# Patient Record
Sex: Female | Born: 1975 | Race: White | Hispanic: No | Marital: Single | Smoking: Never smoker
Health system: Southern US, Community
[De-identification: ages and names within clinical notes are randomized; demographics above are authoritative.]

## PROBLEM LIST (undated history)

## (undated) DIAGNOSIS — Z973 Presence of spectacles and contact lenses: Secondary | ICD-10-CM

## (undated) DIAGNOSIS — R112 Nausea with vomiting, unspecified: Secondary | ICD-10-CM

## (undated) DIAGNOSIS — F909 Attention-deficit hyperactivity disorder, unspecified type: Secondary | ICD-10-CM

## (undated) DIAGNOSIS — Z87442 Personal history of urinary calculi: Secondary | ICD-10-CM

## (undated) DIAGNOSIS — R102 Pelvic and perineal pain: Secondary | ICD-10-CM

## (undated) DIAGNOSIS — G8929 Other chronic pain: Secondary | ICD-10-CM

## (undated) DIAGNOSIS — K219 Gastro-esophageal reflux disease without esophagitis: Secondary | ICD-10-CM

## (undated) DIAGNOSIS — Z87820 Personal history of traumatic brain injury: Secondary | ICD-10-CM

## (undated) DIAGNOSIS — Z9889 Other specified postprocedural states: Secondary | ICD-10-CM

## (undated) HISTORY — PX: OTHER SURGICAL HISTORY: SHX169

---

## 1998-03-24 ENCOUNTER — Other Ambulatory Visit: Admission: RE | Admit: 1998-03-24 | Discharge: 1998-03-24 | Payer: Self-pay | Admitting: Gynecology

## 1998-05-24 ENCOUNTER — Encounter: Payer: Self-pay | Admitting: *Deleted

## 1998-05-24 ENCOUNTER — Emergency Department (HOSPITAL_COMMUNITY): Admission: EM | Admit: 1998-05-24 | Discharge: 1998-05-24 | Payer: Self-pay | Admitting: *Deleted

## 1999-02-22 ENCOUNTER — Inpatient Hospital Stay (HOSPITAL_COMMUNITY): Admission: EM | Admit: 1999-02-22 | Discharge: 1999-03-02 | Payer: Self-pay | Admitting: Emergency Medicine

## 1999-02-22 ENCOUNTER — Encounter: Payer: Self-pay | Admitting: Emergency Medicine

## 1999-02-22 ENCOUNTER — Encounter: Payer: Self-pay | Admitting: General Surgery

## 1999-02-22 HISTORY — PX: OTHER SURGICAL HISTORY: SHX169

## 1999-02-23 ENCOUNTER — Encounter: Payer: Self-pay | Admitting: General Surgery

## 1999-02-24 ENCOUNTER — Encounter: Payer: Self-pay | Admitting: General Surgery

## 1999-02-25 ENCOUNTER — Encounter: Payer: Self-pay | Admitting: General Surgery

## 1999-02-27 ENCOUNTER — Encounter: Payer: Self-pay | Admitting: General Surgery

## 1999-02-28 ENCOUNTER — Encounter: Payer: Self-pay | Admitting: General Surgery

## 1999-04-29 ENCOUNTER — Encounter: Payer: Self-pay | Admitting: Neurosurgery

## 1999-04-29 ENCOUNTER — Ambulatory Visit (HOSPITAL_COMMUNITY): Admission: RE | Admit: 1999-04-29 | Discharge: 1999-04-29 | Payer: Self-pay | Admitting: Neurosurgery

## 2000-01-20 ENCOUNTER — Encounter: Payer: Self-pay | Admitting: Neurosurgery

## 2000-01-20 ENCOUNTER — Ambulatory Visit (HOSPITAL_COMMUNITY): Admission: RE | Admit: 2000-01-20 | Discharge: 2000-01-20 | Payer: Self-pay | Admitting: Neurosurgery

## 2000-02-03 ENCOUNTER — Encounter: Payer: Self-pay | Admitting: Neurosurgery

## 2000-02-03 ENCOUNTER — Ambulatory Visit (HOSPITAL_COMMUNITY): Admission: RE | Admit: 2000-02-03 | Discharge: 2000-02-03 | Payer: Self-pay | Admitting: Neurosurgery

## 2003-03-11 ENCOUNTER — Emergency Department (HOSPITAL_COMMUNITY): Admission: EM | Admit: 2003-03-11 | Discharge: 2003-03-11 | Payer: Self-pay | Admitting: Emergency Medicine

## 2004-05-17 ENCOUNTER — Emergency Department (HOSPITAL_COMMUNITY): Admission: EM | Admit: 2004-05-17 | Discharge: 2004-05-17 | Payer: Self-pay | Admitting: Emergency Medicine

## 2004-05-18 ENCOUNTER — Emergency Department (HOSPITAL_COMMUNITY): Admission: EM | Admit: 2004-05-18 | Discharge: 2004-05-18 | Payer: Self-pay | Admitting: Emergency Medicine

## 2004-05-28 ENCOUNTER — Encounter: Admission: RE | Admit: 2004-05-28 | Discharge: 2004-07-29 | Payer: Self-pay | Admitting: Sports Medicine

## 2004-10-28 ENCOUNTER — Emergency Department (HOSPITAL_COMMUNITY): Admission: EM | Admit: 2004-10-28 | Discharge: 2004-10-28 | Payer: Self-pay | Admitting: Emergency Medicine

## 2005-01-25 ENCOUNTER — Emergency Department (HOSPITAL_COMMUNITY): Admission: EM | Admit: 2005-01-25 | Discharge: 2005-01-25 | Payer: Self-pay | Admitting: Emergency Medicine

## 2005-05-11 ENCOUNTER — Inpatient Hospital Stay (HOSPITAL_COMMUNITY): Admission: AD | Admit: 2005-05-11 | Discharge: 2005-05-11 | Payer: Self-pay | Admitting: Obstetrics & Gynecology

## 2005-05-15 ENCOUNTER — Inpatient Hospital Stay (HOSPITAL_COMMUNITY): Admission: AD | Admit: 2005-05-15 | Discharge: 2005-05-15 | Payer: Self-pay | Admitting: Obstetrics and Gynecology

## 2005-05-27 ENCOUNTER — Ambulatory Visit: Payer: Self-pay | Admitting: Family Medicine

## 2006-02-17 ENCOUNTER — Ambulatory Visit: Payer: Self-pay | Admitting: Obstetrics and Gynecology

## 2006-02-21 ENCOUNTER — Ambulatory Visit (HOSPITAL_COMMUNITY): Admission: RE | Admit: 2006-02-21 | Discharge: 2006-02-21 | Payer: Self-pay | Admitting: Obstetrics and Gynecology

## 2006-09-25 ENCOUNTER — Emergency Department (HOSPITAL_COMMUNITY): Admission: EM | Admit: 2006-09-25 | Discharge: 2006-09-25 | Payer: Self-pay | Admitting: Emergency Medicine

## 2006-10-16 IMAGING — US US PELVIS COMPLETE MODIFY
1 series · 14 of 25 positions shown · non-contrast
Comparison: None

CLINICAL DATA: Abdominal pain

TRANSABDOMINAL AND TRANSVAGINAL PELVIC ULTRASOUND
TECHNIQUE: Both transabdominal and transvaginal ultrasound examinations of the
pelvis were performed including evaluation of the uterus, ovaries, adnexal
regions, and pelvic cul-de-sac.

[Series 1: us pelvis complete modify · 0.43mm/px · 14 of 51 slices shown]
[im 1/51]
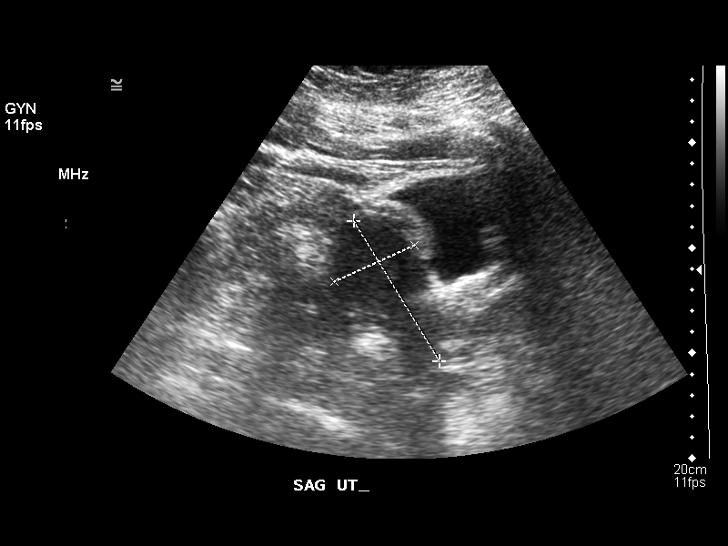
[im 5/51]
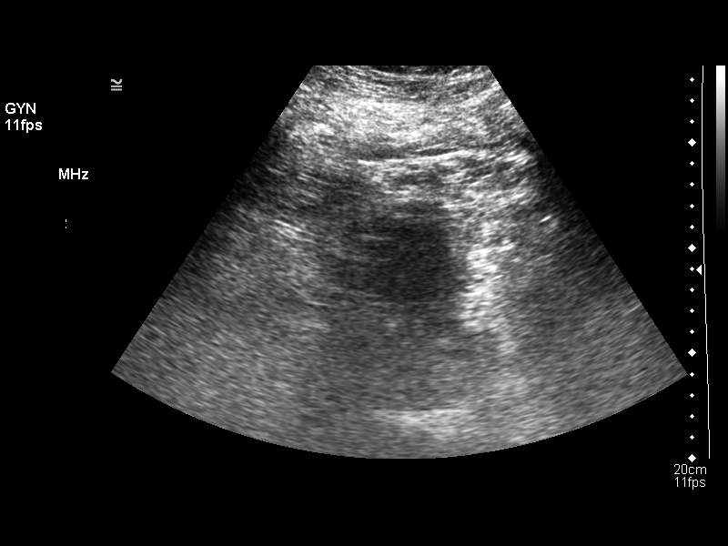
[im 9/51]
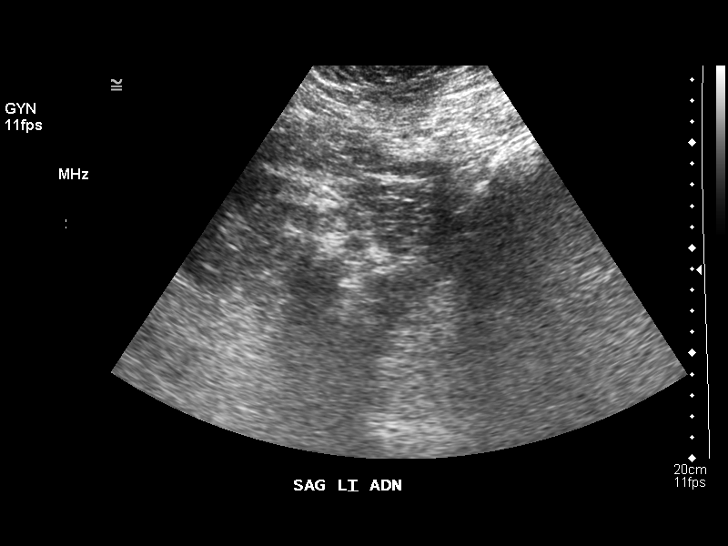
[im 13/51]
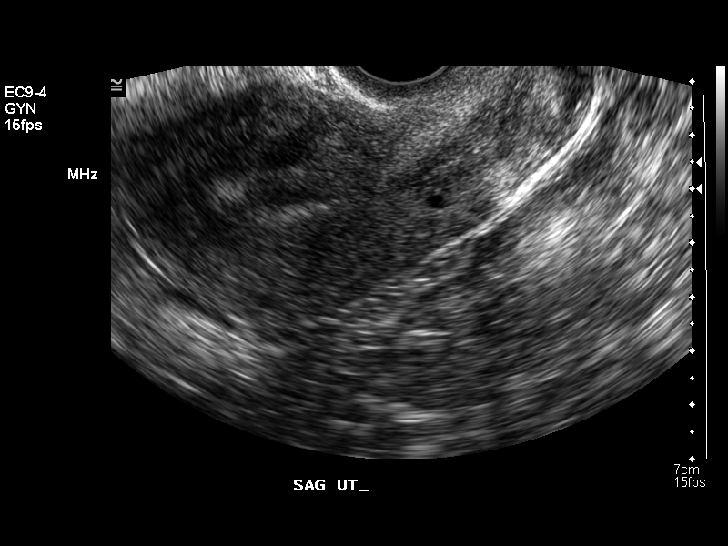
[im 17/51]
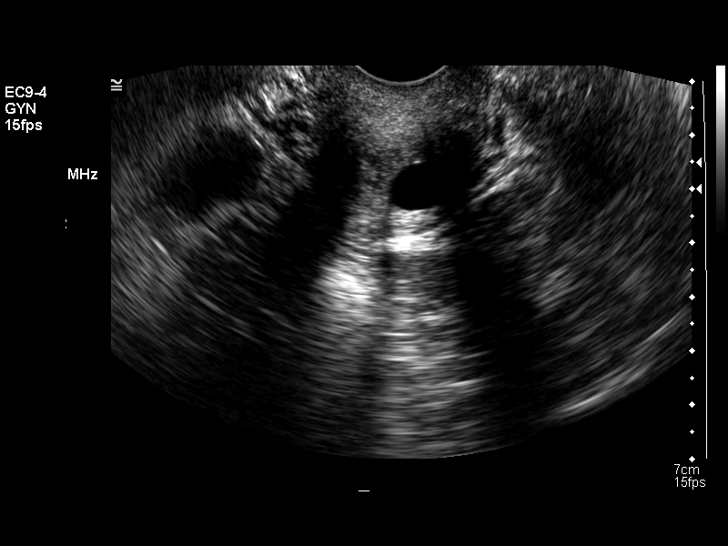
[im 19/51]
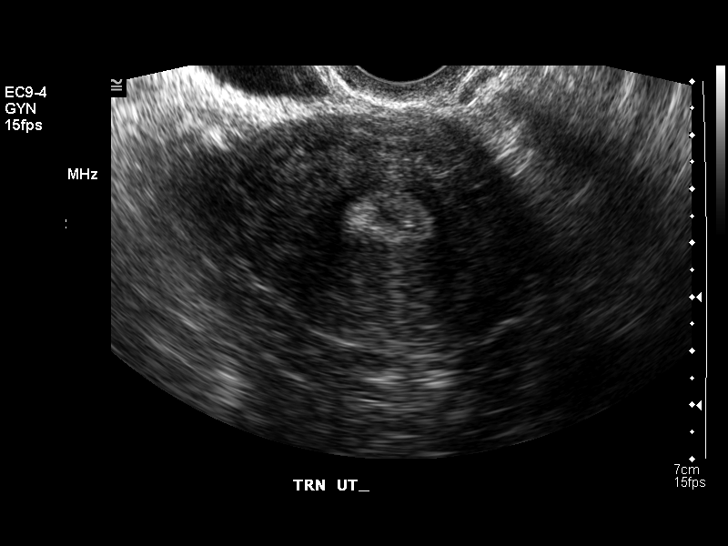
[im 23/51]
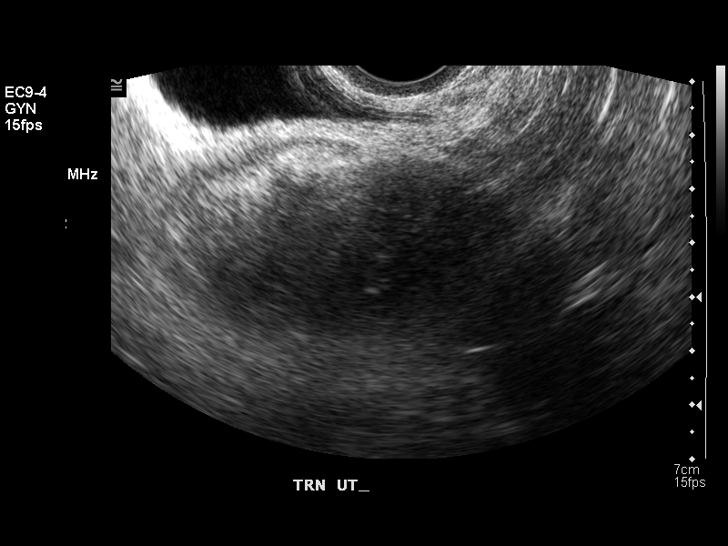
[im 28/51]
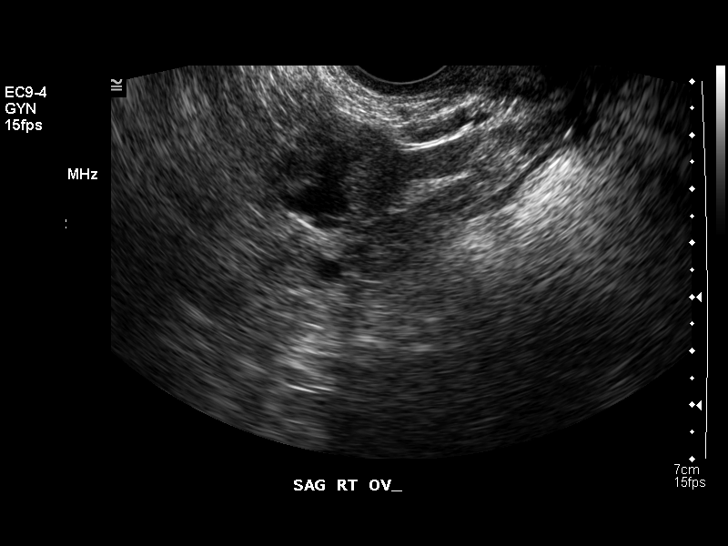
[im 32/51]
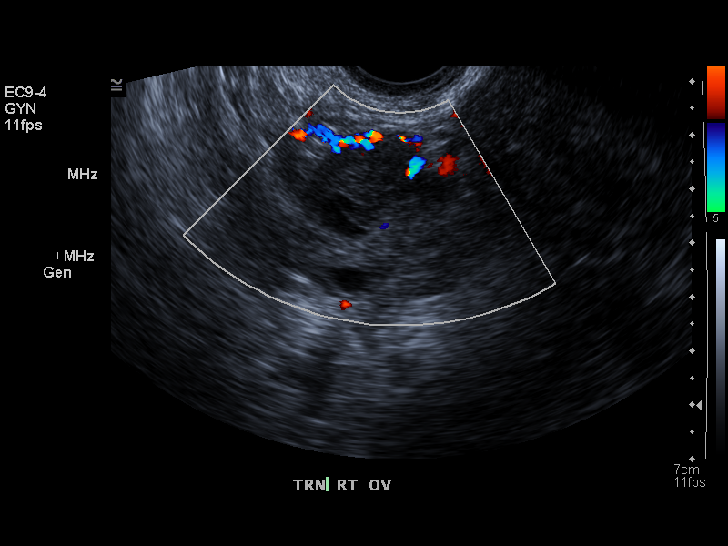
[im 34/51]
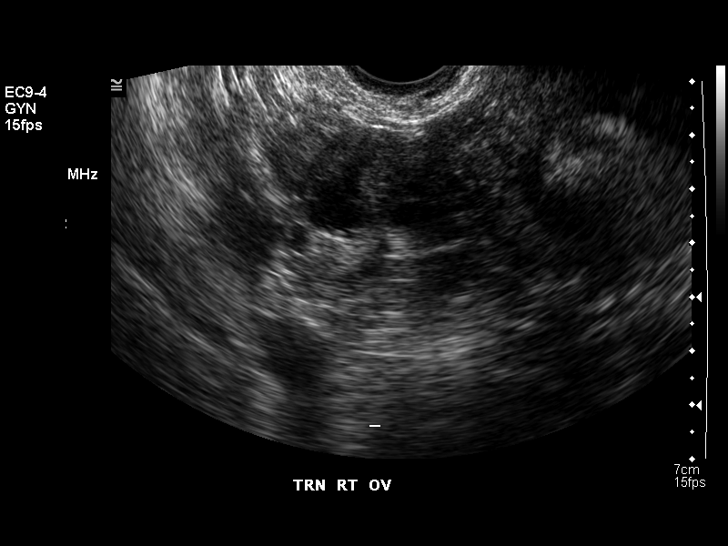
[im 38/51]
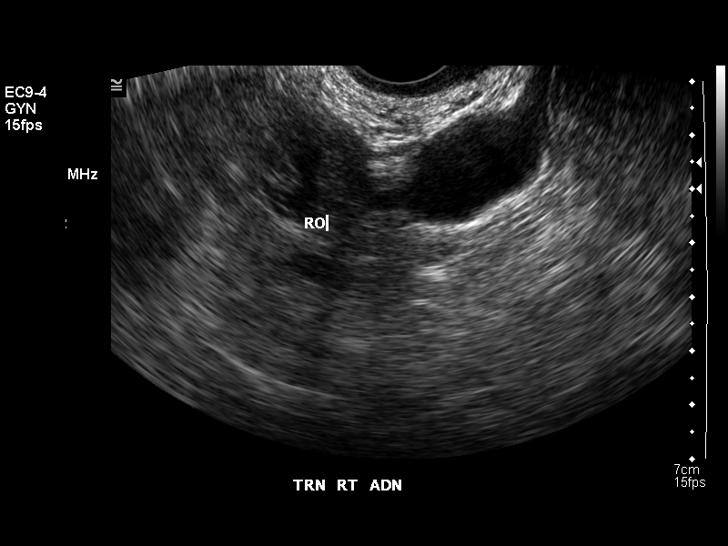
[im 42/51]
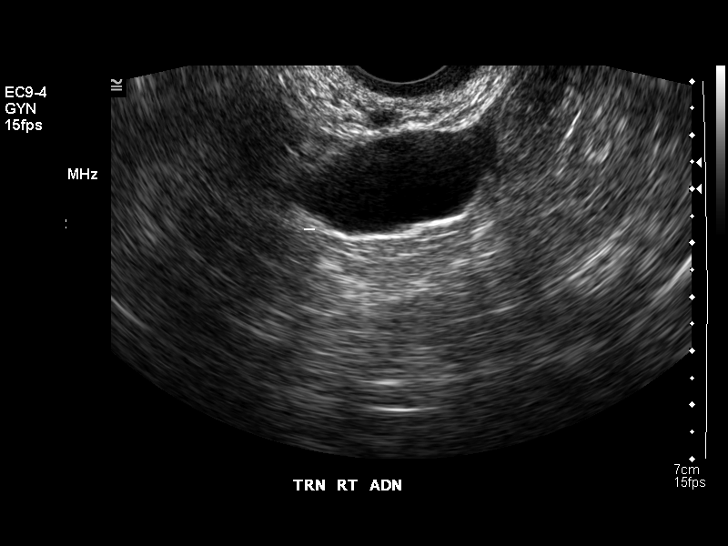
[im 46/51]
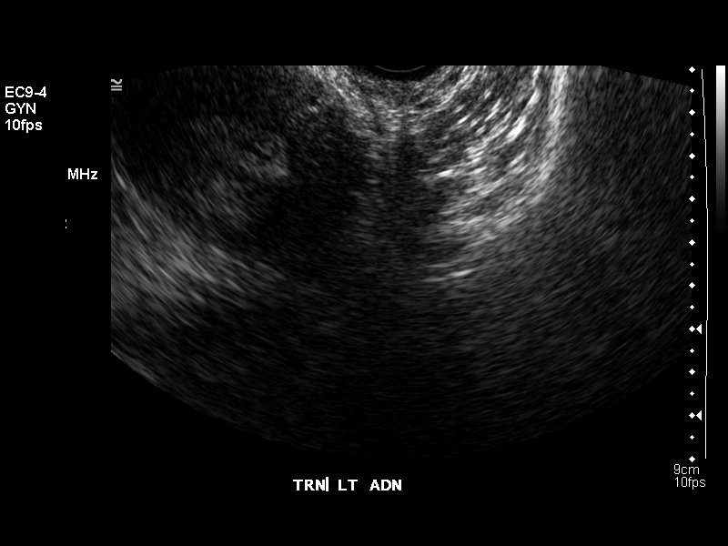
[im 51/51]
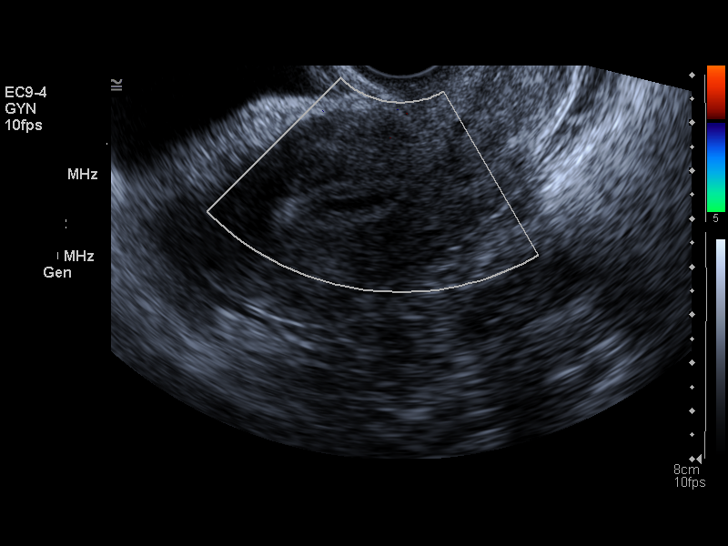

[14 of 25 positions shown; findings below may reference images not displayed]

FINDINGS: Uterus is normal, measuring 7.8 x 4.2 x 6.4 cm. Endometrium 7- 8 mm. A
tiny amount of fluid in the endometrium.

Right ovary measures 3.2 x 2.5 x 2.4 cm. Probable small collapsing cyst in the
right ovary. Within the right adnexa adjacent to the ovary, is a 3.2 cm simple
appearing cyst. Left ovary not visualized. Trace free fluid.

IMPRESSION

3.2 cm right paraovarian cyst. Left ovary not visualized.

## 2007-07-29 IMAGING — US US TRANSVAGINAL NON-OB
1 series · 18 of 25 positions shown · non-contrast
Comparison: Previous exam on 05/11/05.

CLINICAL DATA: Right lower quadrant pain. Prior exam demonstrated a right adnexal cyst. LMP 02/19/06.
TRANSABDOMINAL AND TRANSVAGINAL PELVIC ULTRASOUND:
TECHNIQUE: Both transabdominal and transvaginal ultrasound examinations of the pelvis were performed including evaluation of the uterus, ovaries, adnexal regions, and pelvic cul-de-sac.

[Series 1: us transvaginal non-ob · 18 of 42 slices shown]
[im 1/42]
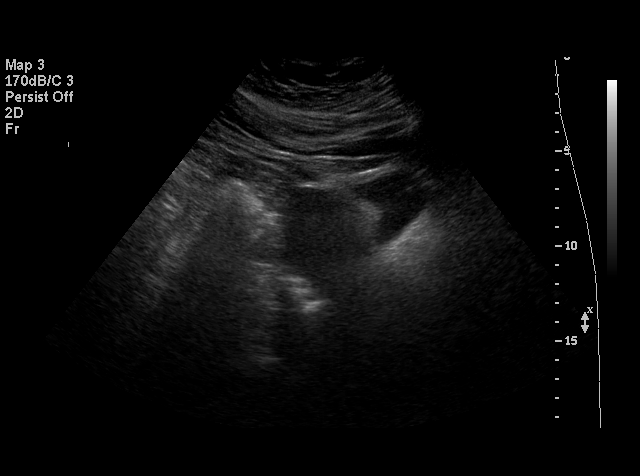
[im 4/42]
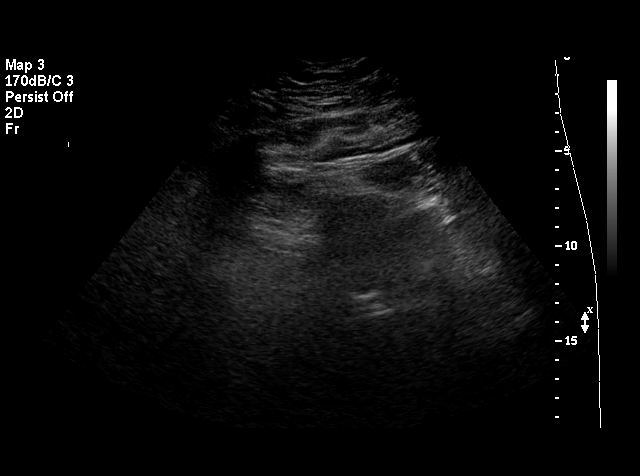
[im 6/42]
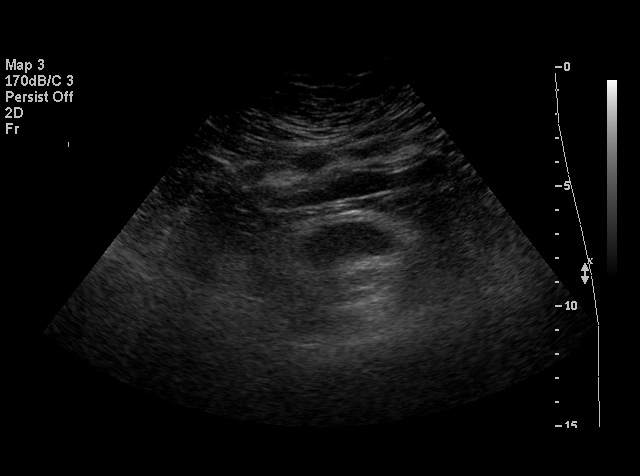
[im 7/42]
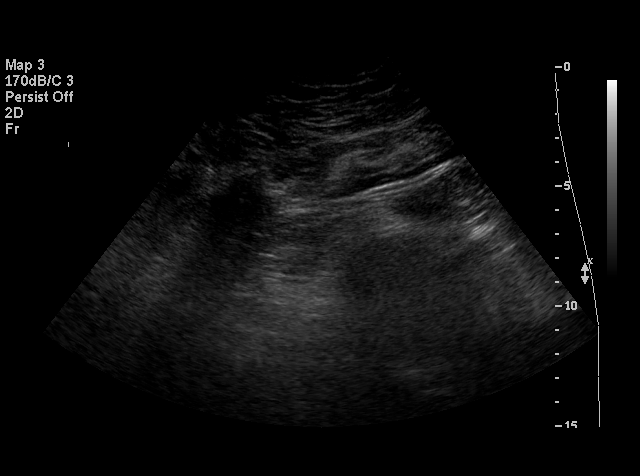
[im 11/42]
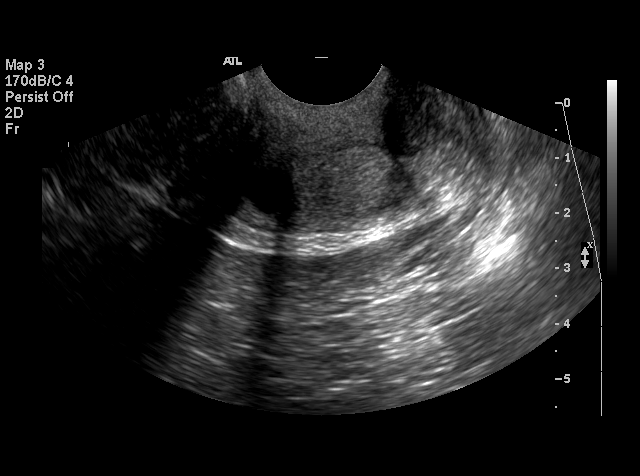
[im 12/42]
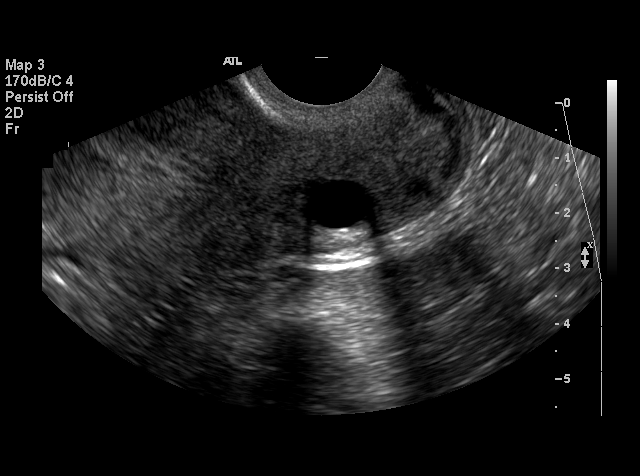
[im 16/42]
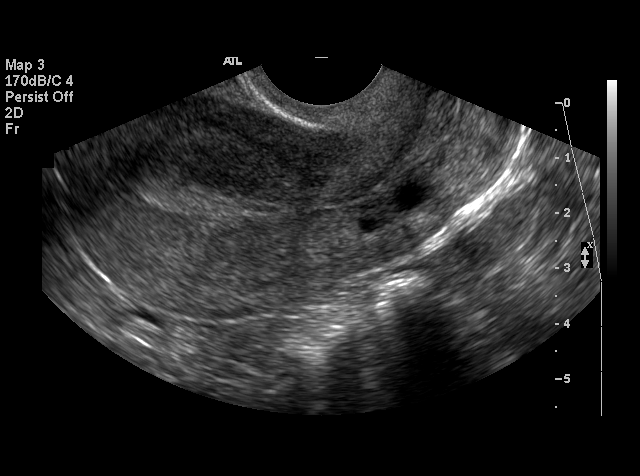
[im 18/42]
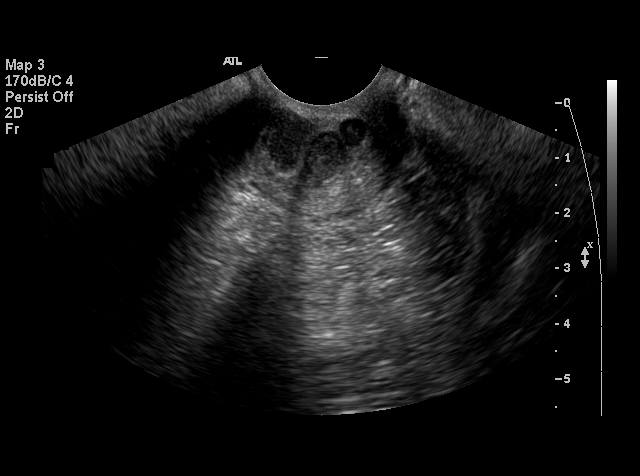
[im 19/42]
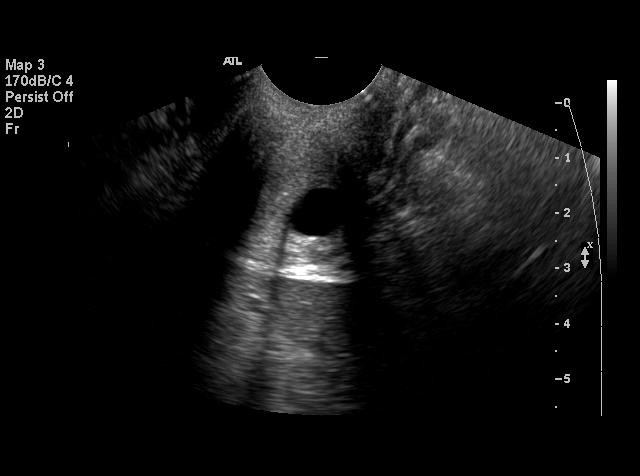
[im 23/42]
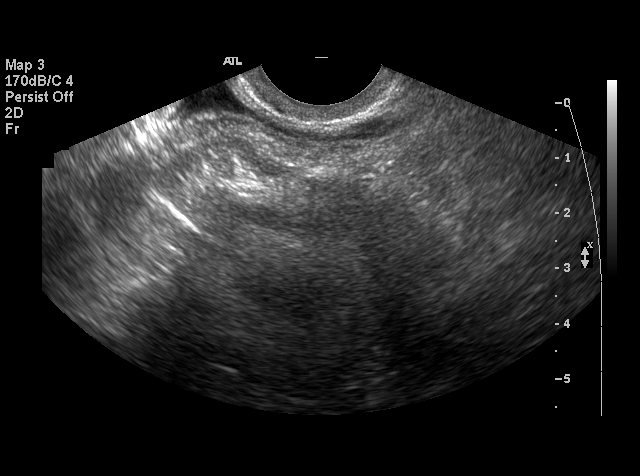
[im 24/42]
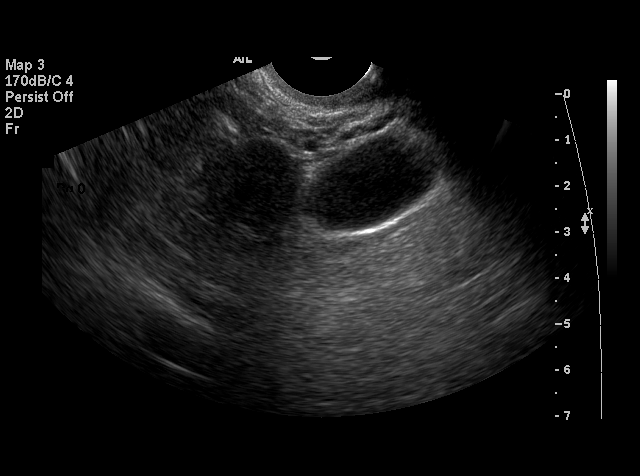
[im 26/42]
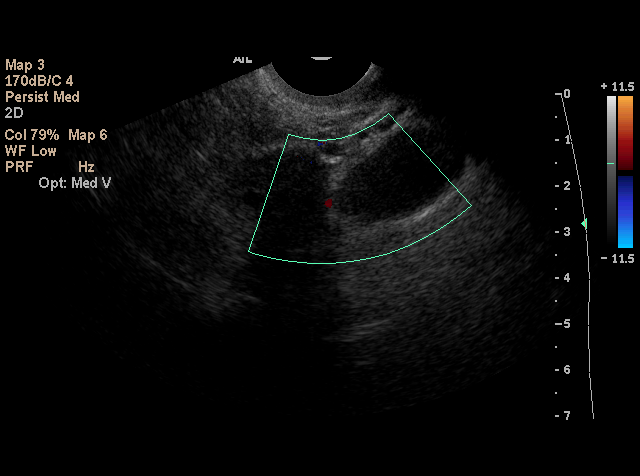
[im 30/42]
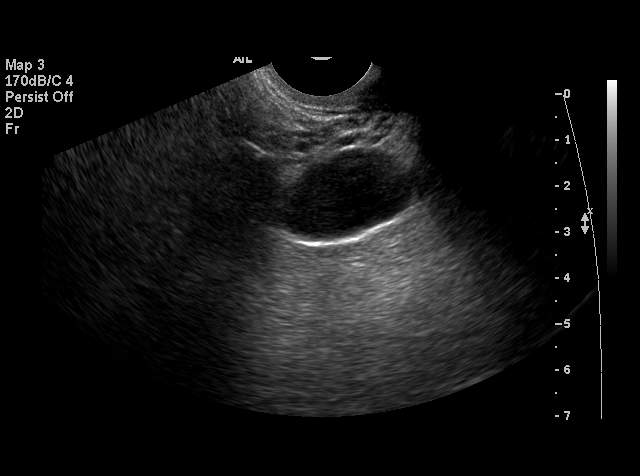
[im 31/42]
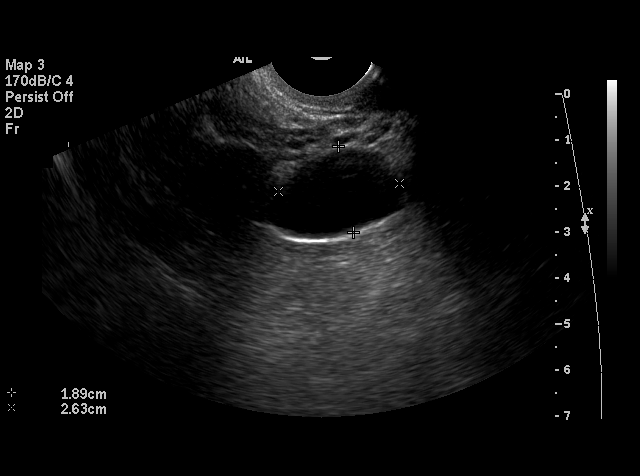
[im 35/42]
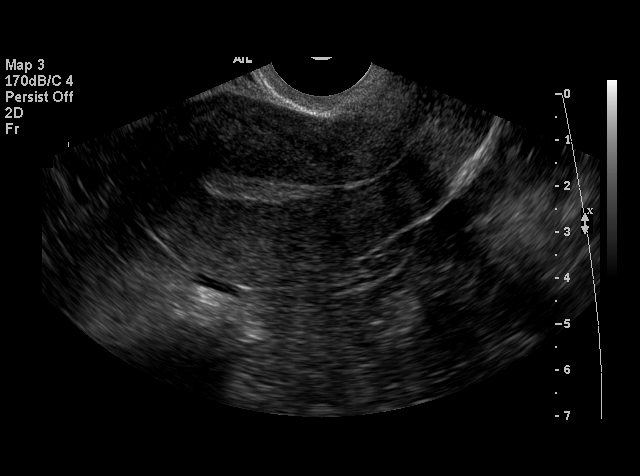
[im 36/42]
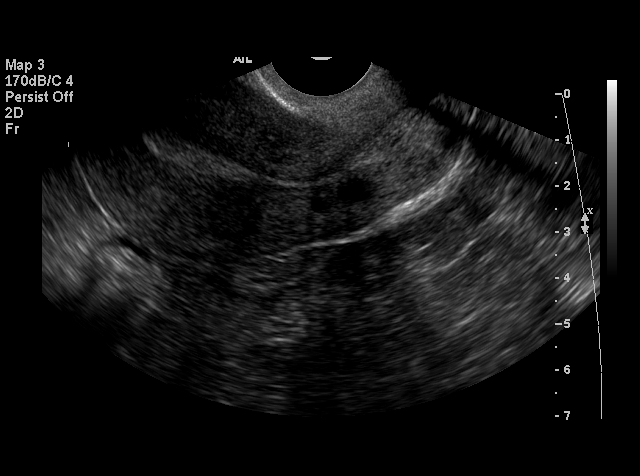
[im 38/42]
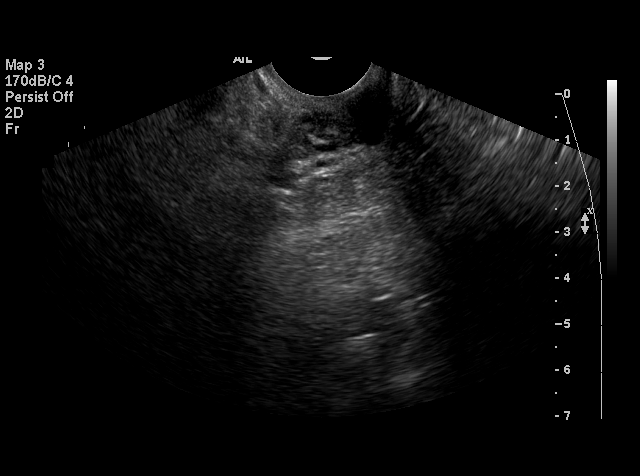
[im 42/42]
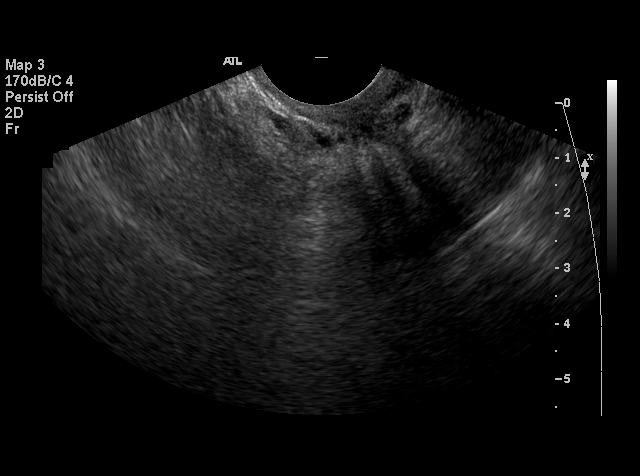

[18 of 25 positions shown; findings below may reference images not displayed]

FINDINGS: The uterus has a sagittal length of 8.1 cm and AP width of 3.9 cm and a transverse width of 5.0 cm.  A homogeneous uterine myometrium is seen.  There is some echogenic material within the endometrial canal compatible with intraluminal blood and correlating with the patient?s current secretory phase of the cycle.  A single layer measurement of the endometrial canal measures 2.1 mm with no areas of focal thickening suggested.
The left ovary could not be seen with confidence either transabdominally or endovaginally.  The right ovary measures 2.0 x 1.6 x 1.5 cm and has a normal appearance.  Directly inferior to the right ovary is an ovoid simple cystic structure measuring 1.9 x 2.6 x 1.9 cm.  This appears thin-walled and is unchanged in appearance in comparison with the prior exam performed in [DATE].  This is large relative to what we would anticipate seeing with a paraovarian cyst and raises the possibility that this may indeed represent a dilated ampullary portion of the right fallopian tube.
No pelvic fluid is seen and no other adnexal masses are noted.
IMPRESSION: 1.  Normal secretory uterine myometrium and endometrium.
2.  Nonvisualized left ovary.
3.  Normal right ovary with a persistent simple right adnexal cyst worrisome for a dilated right fallopian tube.  Please see above report for a more complete discussion.

## 2010-08-07 NOTE — Group Therapy Note (Signed)
NAME:  Peggy Boone, Peggy Boone NO.:  [id number]   MEDICAL RECORD NO.:  [id number]          PATIENT TYPE:  WOC   LOCATION:  [facility]                   FACILITY:  [facility]   PHYSICIAN:  Argentina Donovan, MD        DATE OF BIRTH:  1975/05/25   DATE OF SERVICE:  02/17/2006                                  CLINIC NOTE   The patient is a 35 year old white female who was in last in February of  2007 after being seen in the MAU with lower right quadrant pain, which  showed a 3.2 right paraovarian cyst. The ovaries otherwise look normal.  The patient is 284 pounds and 5 foot 7 inches tall. She comes in today  with a complaint of recurrent right ovarian pain, especially around the  time of her ovulation, midcycle. She has been on oral contraceptives  before with no significant side effects. So we are going to give her a  trial on Lo-Estrin FE 24 for three months to see if this helps the pain.  We will also follow up with an ultrasound to see whether or not the  paraovarian cyst has enlarged or if there is anything else we could see  there, although it sounds like mittlesmirtz. We will call her if there  is any significant findings on the ultrasound.   In addition, she is complaining of a lump just below the mons, which we  examined and she was a little erythematous raising the tissue there, it  looks like a hair follicle abscess forming, probably secondary to her  shaving that hair. We discussed that with her and treatment.   IMPRESSION:  Hair follicle abscess, right lower quadrant pain.           ______________________________  Argentina Donovan, MD     PR/MEDQ  D:  02/17/2006  T:  02/17/2006  Job:  [id number]
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
## 2011-01-05 LAB — POCT PREGNANCY, URINE
Operator id: 151391
Preg Test, Ur: NEGATIVE

## 2012-10-20 HISTORY — PX: ANKLE ARTHROSCOPY: SUR85

## 2013-02-19 HISTORY — PX: LAPAROSCOPIC GASTRIC SLEEVE RESECTION: SHX5895

## 2013-04-22 HISTORY — PX: OTHER SURGICAL HISTORY: SHX169

## 2014-05-27 ENCOUNTER — Encounter (HOSPITAL_BASED_OUTPATIENT_CLINIC_OR_DEPARTMENT_OTHER): Payer: Self-pay | Admitting: *Deleted

## 2014-05-27 NOTE — H&P (Signed)
**Note Peggy Boone-Identified via Obfuscation** NAMMarland Kitchen:  Peggy Boone, Peggy Boone          ACCOUNT NO.:  [id number]  MEDICAL RECORD NO.:  [id number]  LOCATION:                                 FACILITY:  PHYSICIAN:  Juluis MireJohn S. Nakina Spatz, M.D.   DATE OF BIRTH:  1975/10/08  DATE OF ADMISSION:  05/30/2014 DATE OF DISCHARGE:                             HISTORY & PHYSICAL   HISTORY OF PRESENT ILLNESS:  The patient is a 39 year old gravida 2, para 2 female who comes in for laparoscopy.  She notes that her cycles have been regular.  However, for the last 3 months, they have been coming a little more frequently.  She is having significant pain associated with her cycles.  We actually did an ultrasound here in the office.  There was a 3 cm paraovarian cyst that was simple in nature. No blood flow.  No free fluid was noted.  Her saline infusion was unremarkable.  We had discussed that the pain could be coming from things such as endometriosis or adenomyosis.  We discussed birth control pills versus Depo-Provera.  She actually wants to do laparoscopy for which she comes in at the present time.  ALLERGIES:  She is allergic to penicillin and nonsteroidal anti- inflammatories.  MEDICATIONS:  She is on Adderall 30/15 daily, Nexium 20 mg twice daily, tramadol as needed, and a multivitamin.  PAST MEDICAL HISTORY:  She has had history of irritable bowel syndrome and recurrent urinary tract infections.  PAST SURGICAL HISTORY:  Included an ankle implant in February of 2015. 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  She had arthroscopic surgery of her ankle on August of 2014; had jaw replacement surgery in year 2000; and she has had lower back surgery twice, one in 1994, and one in 1996.  OBSTETRICAL HISTORY:  She has had two prior miscarriages.  SOCIAL HISTORY:  Reveals one drink per month.  No smoking history.  FAMILY HISTORY:  Noncontributory.  REVIEW OF SYSTEMS:  Noncontributory.  PHYSICAL EXAMINATION:  VITAL SIGNS:  The patient is afebrile.   Stable vital signs. HEENT:  The patient is normocephalic.  Pupils are equal, round, reactive to light and accommodation.  Extraocular movements were intact.  Sclerae and conjunctivae were clear.  Oropharynx clear. NECK:  Without thyromegaly. BREASTS:  Not examined. LUNGS:  Clear. CARDIOVASCULAR:  Regular rhythm and rate without murmurs or gallops.  No carotid or abdominal bruits. ABDOMEN:  Benign.  No mass, organomegaly, or tenderness. PELVIC:  Normal external genitalia.  Vaginal mucosa is clear.  Cervix unremarkable.  Uterus upper limits, normal size.  Adnexa unremarkable. EXTREMITIES:  Trace edema. NEUROLOGIC:  Grossly in normal limits.  IMPRESSION: 1. Abnormal uterine bleeding and chronic pelvic pain.  Rule out such     things as endometriosis. 2. A right paraovarian cyst.  PLAN:  The patient will undergo diagnostic laparoscopy.  The risks of surgery have been discussed.  This includes the risk of infection.  The risk of hemorrhage that could require transfusion with the risk of AIDS or hepatitis.  Risk of injury to adjacent organs including bladder, bowel, ureters that could require further exploratory surgery.  Risk of deep venous thrombosis and pulmonary embolus.  The patient does understand the indications, risks,  and alternatives.     Juluis Mire, M.D.     JSM/MEDQ  D:  05/27/2014  T:  05/27/2014  Job:  [id number]

## 2014-05-27 NOTE — H&P (Signed)
  Patient name  Peggy Boone, Peggy Boone  Juluis MireMCCOMB,Toryn Mcclinton S, MD 05/27/2014 1:30 PM

## 2014-05-28 ENCOUNTER — Encounter (HOSPITAL_BASED_OUTPATIENT_CLINIC_OR_DEPARTMENT_OTHER): Payer: Self-pay | Admitting: *Deleted

## 2014-05-28 DIAGNOSIS — N838 Other noninflammatory disorders of ovary, fallopian tube and broad ligament: Secondary | ICD-10-CM | POA: Diagnosis not present

## 2014-05-28 DIAGNOSIS — N736 Female pelvic peritoneal adhesions (postinfective): Secondary | ICD-10-CM | POA: Diagnosis not present

## 2014-05-28 DIAGNOSIS — K219 Gastro-esophageal reflux disease without esophagitis: Secondary | ICD-10-CM | POA: Diagnosis not present

## 2014-05-28 DIAGNOSIS — Z79899 Other long term (current) drug therapy: Secondary | ICD-10-CM | POA: Diagnosis not present

## 2014-05-28 DIAGNOSIS — R102 Pelvic and perineal pain: Secondary | ICD-10-CM | POA: Diagnosis present

## 2014-05-28 LAB — CBC
HCT: 40.2 % (ref 36.0–46.0)
Hemoglobin: 13.2 g/dL (ref 12.0–15.0)
MCH: 30.1 pg (ref 26.0–34.0)
MCHC: 32.8 g/dL (ref 30.0–36.0)
MCV: 91.8 fL (ref 78.0–100.0)
Platelets: 241 10*3/uL (ref 150–400)
RBC: 4.38 MIL/uL (ref 3.87–5.11)
RDW: 13.6 % (ref 11.5–15.5)
WBC: 5 10*3/uL (ref 4.0–10.5)

## 2014-05-28 LAB — COMPREHENSIVE METABOLIC PANEL
ALBUMIN: 3.3 g/dL — AB (ref 3.5–5.2)
ALK PHOS: 56 U/L (ref 39–117)
ALT: 26 U/L (ref 0–35)
ANION GAP: 5 (ref 5–15)
AST: 18 U/L (ref 0–37)
BUN: 16 mg/dL (ref 6–23)
CHLORIDE: 107 mmol/L (ref 96–112)
CO2: 27 mmol/L (ref 19–32)
CREATININE: 0.77 mg/dL (ref 0.50–1.10)
Calcium: 8.6 mg/dL (ref 8.4–10.5)
GFR calc non Af Amer: >90 mL/min (ref >90)
Glucose, Bld: 101 mg/dL — ABNORMAL HIGH (ref 70–99)
Potassium: 3.7 mmol/L (ref 3.5–5.1)
SODIUM: 139 mmol/L (ref 135–145)
Total Bilirubin: 1.2 mg/dL (ref 0.3–1.2)
Total Protein: 6.4 g/dL (ref 6.0–8.3)

## 2014-05-28 LAB — HCG, SERUM, QUALITATIVE: Preg, Serum: NEGATIVE

## 2014-05-28 NOTE — Progress Notes (Signed)
NPO AFTER MN. ARRIVE AT 0600. PT TO GET LAB WORK DONE Wednesday 05-29-2014.

## 2014-05-29 NOTE — Anesthesia Preprocedure Evaluation (Addendum)
Anesthesia Evaluation  Patient identified by MRN, date of birth, ID band Patient awake    Reviewed: Allergy & Precautions, H&P , NPO status , Patient's Chart, lab work & pertinent test results  History of Anesthesia Complications (+) PONV  Airway Mallampati: II  TM Distance: >3 FB Neck ROM: full    Dental  (+) Dental Advisory Given, Caps All front teeth are capped:   Pulmonary neg pulmonary ROS,  breath sounds clear to auscultation  Pulmonary exam normal       Cardiovascular Exercise Tolerance: Good negative cardio ROS  Rhythm:regular Rate:Normal     Neuro/Psych negative neurological ROS  negative psych ROS   GI/Hepatic negative GI ROS, Neg liver ROS, GERD-  Medicated and Controlled,  Endo/Other  negative endocrine ROS  Renal/GU negative Renal ROS  negative genitourinary   Musculoskeletal   Abdominal   Peds  Hematology negative hematology ROS (+)   Anesthesia Other Findings   Reproductive/Obstetrics negative OB ROS                            Anesthesia Physical Anesthesia Plan  ASA: I  Anesthesia Plan: General   Post-op Pain Management:    Induction: Intravenous  Airway Management Planned: Oral ETT  Additional Equipment:   Intra-op Plan:   Post-operative Plan: Extubation in OR  Informed Consent: I have reviewed the patients History and Physical, chart, labs and discussed the procedure including the risks, benefits and alternatives for the proposed anesthesia with the patient or authorized representative who has indicated his/her understanding and acceptance.   Dental Advisory Given  Plan Discussed with: CRNA and Surgeon  Anesthesia Plan Comments:        Anesthesia Quick Evaluation

## 2014-05-30 ENCOUNTER — Encounter (HOSPITAL_BASED_OUTPATIENT_CLINIC_OR_DEPARTMENT_OTHER): Payer: Self-pay | Admitting: *Deleted

## 2014-05-30 ENCOUNTER — Ambulatory Visit (HOSPITAL_BASED_OUTPATIENT_CLINIC_OR_DEPARTMENT_OTHER): Payer: BLUE CROSS/BLUE SHIELD | Admitting: Anesthesiology

## 2014-05-30 ENCOUNTER — Encounter (HOSPITAL_BASED_OUTPATIENT_CLINIC_OR_DEPARTMENT_OTHER)
Admission: RE | Disposition: A | Discharge status: Home / Self Care | Payer: Self-pay | Source: Ambulatory Visit | Attending: Obstetrics and Gynecology

## 2014-05-30 ENCOUNTER — Ambulatory Visit (HOSPITAL_BASED_OUTPATIENT_CLINIC_OR_DEPARTMENT_OTHER)
Admission: RE | Admit: 2014-05-30 | Discharge: 2014-05-30 | Disposition: A | Discharge status: Home / Self Care | Payer: BLUE CROSS/BLUE SHIELD | Source: Ambulatory Visit | Attending: Obstetrics and Gynecology | Admitting: Obstetrics and Gynecology

## 2014-05-30 DIAGNOSIS — Z79899 Other long term (current) drug therapy: Secondary | ICD-10-CM | POA: Insufficient documentation

## 2014-05-30 DIAGNOSIS — N736 Female pelvic peritoneal adhesions (postinfective): Secondary | ICD-10-CM | POA: Insufficient documentation

## 2014-05-30 DIAGNOSIS — N838 Other noninflammatory disorders of ovary, fallopian tube and broad ligament: Secondary | ICD-10-CM | POA: Diagnosis not present

## 2014-05-30 DIAGNOSIS — K219 Gastro-esophageal reflux disease without esophagitis: Secondary | ICD-10-CM | POA: Insufficient documentation

## 2014-05-30 HISTORY — DX: Gastro-esophageal reflux disease without esophagitis: K21.9

## 2014-05-30 HISTORY — DX: Pelvic and perineal pain: R10.2

## 2014-05-30 HISTORY — DX: Presence of spectacles and contact lenses: Z97.3

## 2014-05-30 HISTORY — DX: Other chronic pain: G89.29

## 2014-05-30 HISTORY — DX: Attention-deficit hyperactivity disorder, unspecified type: F90.9

## 2014-05-30 HISTORY — DX: Personal history of traumatic brain injury: Z87.820

## 2014-05-30 HISTORY — DX: Nausea with vomiting, unspecified: R11.2

## 2014-05-30 HISTORY — PX: LAPAROSCOPY: SHX197

## 2014-05-30 HISTORY — DX: Other specified postprocedural states: Z98.890

## 2014-05-30 SURGERY — LAPAROSCOPY, DIAGNOSTIC
Anesthesia: General | Site: Abdomen

## 2014-05-30 MED ORDER — ACETAMINOPHEN 10 MG/ML IV SOLN
INTRAVENOUS | Status: DC | PRN
  Administered 2014-05-30: 1000 mg via INTRAVENOUS

## 2014-05-30 MED ORDER — GLYCOPYRROLATE 0.2 MG/ML IJ SOLN
INTRAMUSCULAR | Status: DC | PRN
  Administered 2014-05-30: 0.4 mg via INTRAVENOUS

## 2014-05-30 MED ORDER — LACTATED RINGERS IV SOLN
INTRAVENOUS | Status: DC
  Administered 2014-05-30 (×2): via INTRAVENOUS
  Filled 2014-05-30: qty 1000

## 2014-05-30 MED ORDER — MIDAZOLAM HCL 2 MG/2ML IJ SOLN
INTRAMUSCULAR | Status: AC
  Filled 2014-05-30: qty 2

## 2014-05-30 MED ORDER — LIDOCAINE HCL (CARDIAC) 20 MG/ML IV SOLN
INTRAVENOUS | Status: DC | PRN
  Administered 2014-05-30: 60 mg via INTRAVENOUS

## 2014-05-30 MED ORDER — CIPROFLOXACIN IN D5W 400 MG/200ML IV SOLN
INTRAVENOUS | Status: AC
  Filled 2014-05-30: qty 200

## 2014-05-30 MED ORDER — LACTATED RINGERS IR SOLN
Status: DC | PRN
  Administered 2014-05-30: 3000 mL

## 2014-05-30 MED ORDER — PROPOFOL 10 MG/ML IV BOLUS
INTRAVENOUS | Status: DC | PRN
  Administered 2014-05-30: 200 mg via INTRAVENOUS

## 2014-05-30 MED ORDER — DEXAMETHASONE SODIUM PHOSPHATE 4 MG/ML IJ SOLN
INTRAMUSCULAR | Status: DC | PRN
  Administered 2014-05-30: 8 mg via INTRAVENOUS

## 2014-05-30 MED ORDER — HYDROMORPHONE HCL 1 MG/ML PO LIQD
1.0000 mg | Freq: Once | ORAL | Status: AC
  Administered 2014-05-30: 1 mg via ORAL
  Filled 2014-05-30 (×2): qty 1

## 2014-05-30 MED ORDER — BUPIVACAINE HCL 0.25 % IJ SOLN
INTRAMUSCULAR | Status: DC | PRN
  Administered 2014-05-30: 5 mL

## 2014-05-30 MED ORDER — LACTATED RINGERS IV SOLN
INTRAVENOUS | Status: DC
  Filled 2014-05-30: qty 1000

## 2014-05-30 MED ORDER — CIPROFLOXACIN IN D5W 400 MG/200ML IV SOLN
400.0000 mg | INTRAVENOUS | Status: AC
  Administered 2014-05-30: 400 mg via INTRAVENOUS
  Filled 2014-05-30: qty 200

## 2014-05-30 MED ORDER — ONDANSETRON HCL 4 MG/2ML IJ SOLN
INTRAMUSCULAR | Status: DC | PRN
  Administered 2014-05-30: 4 mg via INTRAVENOUS

## 2014-05-30 MED ORDER — FENTANYL CITRATE 0.05 MG/ML IJ SOLN
25.0000 ug | INTRAMUSCULAR | Status: DC | PRN
  Administered 2014-05-30: 25 ug via INTRAVENOUS
  Administered 2014-05-30: 50 ug via INTRAVENOUS
  Administered 2014-05-30: 25 ug via INTRAVENOUS
  Filled 2014-05-30: qty 1

## 2014-05-30 MED ORDER — SUCCINYLCHOLINE CHLORIDE 20 MG/ML IJ SOLN
INTRAMUSCULAR | Status: DC | PRN
  Administered 2014-05-30: 100 mg via INTRAVENOUS

## 2014-05-30 MED ORDER — NEOSTIGMINE METHYLSULFATE 10 MG/10ML IV SOLN
INTRAVENOUS | Status: DC | PRN
  Administered 2014-05-30: 3 mg via INTRAVENOUS

## 2014-05-30 MED ORDER — CLINDAMYCIN PHOSPHATE 900 MG/50ML IV SOLN
900.0000 mg | INTRAVENOUS | Status: AC
  Administered 2014-05-30: 900 mg via INTRAVENOUS
  Filled 2014-05-30: qty 50

## 2014-05-30 MED ORDER — ROCURONIUM BROMIDE 100 MG/10ML IV SOLN
INTRAVENOUS | Status: DC | PRN
  Administered 2014-05-30: 25 mg via INTRAVENOUS

## 2014-05-30 MED ORDER — FENTANYL CITRATE 0.05 MG/ML IJ SOLN
INTRAMUSCULAR | Status: AC
  Filled 2014-05-30: qty 2

## 2014-05-30 MED ORDER — SODIUM CHLORIDE 0.9 % IR SOLN
Status: DC | PRN
  Administered 2014-05-30: 500 mL

## 2014-05-30 MED ORDER — FENTANYL CITRATE 0.05 MG/ML IJ SOLN
INTRAMUSCULAR | Status: DC | PRN
  Administered 2014-05-30: 50 ug via INTRAVENOUS
  Administered 2014-05-30: 25 ug via INTRAVENOUS
  Administered 2014-05-30: 50 ug via INTRAVENOUS
  Administered 2014-05-30: 25 ug via INTRAVENOUS
  Administered 2014-05-30: 50 ug via INTRAVENOUS

## 2014-05-30 MED ORDER — CLINDAMYCIN PHOSPHATE 900 MG/50ML IV SOLN
INTRAVENOUS | Status: AC
  Filled 2014-05-30: qty 50

## 2014-05-30 MED ORDER — MIDAZOLAM HCL 5 MG/5ML IJ SOLN
INTRAMUSCULAR | Status: DC | PRN
  Administered 2014-05-30: 2 mg via INTRAVENOUS

## 2014-05-30 MED ORDER — FENTANYL CITRATE 0.05 MG/ML IJ SOLN
INTRAMUSCULAR | Status: AC
  Filled 2014-05-30: qty 6

## 2014-05-30 MED ORDER — HYDROMORPHONE HCL 1 MG/ML PO LIQD
1.0000 mg | Freq: Four times a day (QID) | ORAL | Status: DC | PRN
Start: 2014-05-30 — End: 2014-05-30
  Filled 2014-05-30: qty 1

## 2014-05-30 SURGICAL SUPPLY — 35 items
APPLICATOR COTTON TIP 6IN STRL (MISCELLANEOUS) ×3 IMPLANT
BANDAGE ADHESIVE 1X3 (GAUZE/BANDAGES/DRESSINGS) IMPLANT
BLADE SURG 11 STRL SS (BLADE) ×3 IMPLANT
CANISTER SUCTION 2500CC (MISCELLANEOUS) ×2 IMPLANT
CATH ROBINSON RED A/P 16FR (CATHETERS) ×2 IMPLANT
COVER MAYO STAND STRL (DRAPES) ×3 IMPLANT
ELECT REM PT RETURN 9FT ADLT (ELECTROSURGICAL) ×3
ELECTRODE REM PT RTRN 9FT ADLT (ELECTROSURGICAL) ×1 IMPLANT
GLOVE BIO SURGEON STRL SZ 6.5 (GLOVE) ×1 IMPLANT
GLOVE BIO SURGEON STRL SZ7 (GLOVE) ×6 IMPLANT
GLOVE BIO SURGEONS STRL SZ 6.5 (GLOVE) ×1
GLOVE BIOGEL PI IND STRL 6.5 (GLOVE) IMPLANT
GLOVE BIOGEL PI INDICATOR 6.5 (GLOVE) ×2
GOWN STRL REUS W/TWL XL LVL3 (GOWN DISPOSABLE) ×6 IMPLANT
IV LACTATED RINGER IRRG 3000ML (IV SOLUTION) ×3
IV LR IRRIG 3000ML ARTHROMATIC (IV SOLUTION) IMPLANT
LEGGING LITHOTOMY PAIR STRL (DRAPES) ×3 IMPLANT
LIQUID BAND (GAUZE/BANDAGES/DRESSINGS) ×3 IMPLANT
NS IRRIG 500ML POUR BTL (IV SOLUTION) ×3 IMPLANT
PACK BASIN DAY SURGERY FS (CUSTOM PROCEDURE TRAY) ×3 IMPLANT
PACK LAPAROSCOPY II (CUSTOM PROCEDURE TRAY) ×3 IMPLANT
PAD OB MATERNITY 4.3X12.25 (PERSONAL CARE ITEMS) ×3 IMPLANT
PAD PREP 24X48 CUFFED NSTRL (MISCELLANEOUS) ×3 IMPLANT
SET IRRIG TUBING LAPAROSCOPIC (IRRIGATION / IRRIGATOR) ×2 IMPLANT
SOLUTION ANTI FOG 6CC (MISCELLANEOUS) ×3 IMPLANT
SUT VIC AB 3-0 PS2 18 (SUTURE) ×3
SUT VIC AB 3-0 PS2 18XBRD (SUTURE) ×1 IMPLANT
SUT VICRYL 0 UR6 27IN ABS (SUTURE) ×4 IMPLANT
SUT VICRYL 4-0 PS2 18IN ABS (SUTURE) IMPLANT
TOWEL OR 17X24 6PK STRL BLUE (TOWEL DISPOSABLE) ×6 IMPLANT
TRAY DSU PREP LF (CUSTOM PROCEDURE TRAY) ×3 IMPLANT
TROCAR BALLN 12MMX100 BLUNT (TROCAR) ×2 IMPLANT
TROCAR OPTI TIP 5M 100M (ENDOMECHANICALS) ×3 IMPLANT
TUBING INSUFFLATION 10FT LAP (TUBING) ×3 IMPLANT
WARMER LAPAROSCOPE (MISCELLANEOUS) ×3 IMPLANT

## 2014-05-30 NOTE — Op Note (Signed)
NAMGarlan Fillers:  Kaucher, [place]          ACCOUNT NO.:  [id number]  MEDICAL RECORD NO.:  [id number]  LOCATION:                                 FACILITY:  PHYSICIAN:  Juluis MireJohn S. Bena Kobel, M.D.   DATE OF BIRTH:  Oct 26, 1975  DATE OF PROCEDURE:  05/30/2014 DATE OF DISCHARGE:  05/30/2014                              OPERATIVE REPORT   PREOPERATIVE DIAGNOSIS:  Chronic pelvic pain.  POSTOPERATIVE DIAGNOSIS:  Chronic pelvic pain with a peritubular cyst on the right and pelvic adhesions.  OPERATIVE PROCEDURE:  Open laparoscopy, lysis of adhesions, removal of right peritubal cyst.  SURGEON:  Juluis MireJohn S. Obdulia Steier, M.D.  ANESTHESIA:  General endotracheal.  ESTIMATED BLOOD LOSS:  Minimal.  PACKS AND DRAINS:  None.  INTRAOPERATIVE BLOOD PLACED:  None.  COMPLICATIONS:  None.  INDICATION:  Dictated history and physical.  DESCRIPTION OF PROCEDURE:  The patient was taken to the OR.  She was placed in supine position.  After satisfactory level of general endotracheal anesthesia obtained, she was placed in the dorsal lithotomy position using the Allen stirrups.  The abdomen, perineum, and vagina were prepped out with Betadine.  Bladder was emptied by in and out catheterization.  A Hulka tenaculum was put in place and secured.  The patient was then draped in sterile field.  Subumbilical incision was made with knife and carried through subcutaneous tissue.  Fascia was identified and entered sharply, incision fashioned laterally.  The peritoneum was entered with blunt finger pressure.  An open laparoscopic trocar was put in place and secured.  The abdomen was deflated with carbon dioxide.  The laparoscope was introduced.  There was no evidence of injury to adjacent organs.  The upper abdomen was normal, including liver and gallbladder.  Both lateral gutters were clear.  Appendix appeared to be retrocecal, did not see any signs of inflammation or adhesions.  A 5 mm trocar was put in place under direct  visualization. Uterus was elevated.  The right ovary was normal.  There was a peritubal cyst on the right side and one additional adhesion from the ovary to the pelvic floor.  The left side of the ovary did have some adhesions to the left pelvic sidewall.  Cul-de-sac also had a few adhesions.  I did not see any active endometriosis or other issues.  At this point in time, we grasped the paraovarian cyst.  We drained it and excised it and removed it through the suprapubic port.  It was sent for Pathology.  We used the bipolar to bring about hemostasis to this area.  The adhesions in the cul-de-sac were taken down sharply and cauterized.  The adhesions to the left ovary were taken down sharply.  We did have a little area of oozing, brought control with the bipolar.  At this point in time, both ovaries were free, cul-de-sac was clear.  The peritubular cyst had been removed successfully and we had good hemostasis.  At this point in time, the abdomen was deflated with carbon dioxide.  All trocars were removed. Subumbilical fascia was closed with a figure-of-eight of 0 Vicryl.  Skin was closed with interrupted subcuticulars of 4-0 Vicryl.  Suprapubic incision was closed with Dermabond.  The Hulka  tenaculum was removed intact.  The patient was taken down from the dorsal lithotomy position. Once alert and extubated, transferred to recovery in good condition. Sponge, instrument, and needle count was correct by circulating nurse x2.     Juluis Mire, M.D.     JSM/MEDQ  D:  05/30/2014  T:  05/30/2014  Job:  [id number]

## 2014-05-30 NOTE — Anesthesia Procedure Notes (Signed)
Procedure Name: Intubation Date/Time: 05/30/2014 7:31 AM Performed by: Renella CunasHAZEL, Jermani Eberlein D Pre-anesthesia Checklist: Patient identified, Emergency Drugs available, Suction available and Patient being monitored Patient Re-evaluated:Patient Re-evaluated prior to inductionOxygen Delivery Method: Circle System Utilized Preoxygenation: Pre-oxygenation with 100% oxygen Intubation Type: IV induction Ventilation: Mask ventilation without difficulty Laryngoscope Size: Mac and 3 Grade View: Grade I Tube type: Oral Tube size: 7.0 mm Number of attempts: 1 Airway Equipment and Method: Stylet and Oral airway Placement Confirmation: ETT inserted through vocal cords under direct vision,  positive ETCO2 and breath sounds checked- equal and bilateral Secured at: 21 cm Tube secured with: Tape Dental Injury: Teeth and Oropharynx as per pre-operative assessment

## 2014-05-30 NOTE — Brief Op Note (Signed)
05/30/2014  8:24 AM  PATIENT:  Peggy Boone  39 y.o. female  PRE-OPERATIVE DIAGNOSIS:  pelvic pain  POST-OPERATIVE DIAGNOSIS:  pelvic pain  PROCEDURE:  Procedure(s): LAPAROSCOPY DIAGNOSTIC, lysis of adhesions, removal of paratubular cyst (N/A)  SURGEON:  Surgeon(s) and Role:    * Richardean ChimeraJohn Jelesa Mangini, MD - Primary  PHYSICIAN ASSISTANT:   ASSISTANTS: none   ANESTHESIA:   general  EBL:  Total I/O In: 1000 [I.V.:1000] Out: -   BLOOD ADMINISTERED:none  DRAINS: none   LOCAL MEDICATIONS USED:  MARCAINE     SPECIMEN:  Source of Specimen:  paratubal cyst  DISPOSITION OF SPECIMEN:  PATHOLOGY  COUNTS:  YES  TOURNIQUET:  * No tourniquets in log *  DICTATION: .Other Dictation: Dictation Number [id number]  PLAN OF CARE: Discharge to home after PACU  PATIENT DISPOSITION:  PACU - hemodynamically stable.   Delay start of Pharmacological VTE agent (>24hrs) due to surgical blood loss or risk of bleeding: not applicable

## 2014-05-30 NOTE — H&P (Signed)
  History and physical exam unchanged 

## 2014-05-30 NOTE — Discharge Instructions (Signed)
Post Anesthesia Home Care Instructions  Activity: Get plenty of rest for the remainder of the day. A responsible adult should stay with you for 24 hours following the procedure.  For the next 24 hours, DO NOT: -Drive a car -Advertising copywriterperate machinery -Drink alcoholic beverages -Take any medication unless instructed by your physician -Make any legal decisions or sign important papers.  Meals: Start with liquid foods such as gelatin or soup. Progress to regular foods as tolerated. Avoid greasy, spicy, heavy foods. If nausea and/or vomiting occur, drink only clear liquids until the nausea and/or vomiting subsides. Call your physician if vomiting continues.  Special Instructions/Symptoms: Your throat may feel dry or sore from the anesthesia or the breathing tube placed in your throat during surgery. If this causes discomfort, gargle with warm salt water. The discomfort should disappear within 24 hours.  Diagnostic Laparoscopy Laparoscopy is a surgical procedure. It is used to diagnose and treat diseases inside the belly (abdomen). It is usually a brief, common, and relatively simple procedure. The laparoscopeis a thin, lighted, pencil-sized instrument. It is like a telescope. It is inserted into your abdomen through a small cut (incision). Your caregiver can look at the organs inside your body through this instrument. He or she can see if there is anything abnormal. Laparoscopy can be done either in a hospital or outpatient clinic. You may be given a mild sedative to help you relax before the procedure. Once in the operating room, you will be given a drug to make you sleep (general anesthesia). Laparoscopy usually lasts less than 1 hour. After the procedure, you will be monitored in a recovery area until you are stable and doing well. Once you are home, it will take 2 to 3 days to fully recover. RISKS AND COMPLICATIONS  Laparoscopy has relatively few risks. Your caregiver will discuss the risks with you  before the procedure. Some problems that can occur include:  Infection.  Bleeding.  Damage to other organs.  Anesthetic side effects. PROCEDURE Once you receive anesthesia, your surgeon inflates the abdomen with a harmless gas (carbon dioxide). This makes the organs easier to see. The laparoscope is inserted into the abdomen through a small incision. This allows your surgeon to see into the abdomen. Other small instruments are also inserted into the abdomen through other small openings. Many surgeons attach a video camera to the laparoscope to enlarge the view. During a diagnostic laparoscopy, the surgeon may be looking for inflammation, infection, or cancer. Your surgeon may take tissue samples(biopsies). The samples are sent to a specialist in looking at cells and tissue samples (pathologist). The pathologist examines them under a microscope. Biopsies can help to diagnose or confirm a disease. AFTER THE PROCEDURE   The gas is released from inside the abdomen.  The incisions are closed with stitches (sutures). Because these incisions are small (usually less than 1/2 inch), there is usually minimal discomfort after the procedure. There may be some mild discomfort in the throat. This is from the tube placed in the throat while you were sleeping. You may have some mild abdominal discomfort. There may also be discomfort from the instrument placement incisions in the abdomen.  The recovery time is shortened as long as there are no complications.  You will rest in a recovery room until stable and doing well. As long as there are no complications, you may be allowed to go home. FINDING OUT THE RESULTS OF YOUR TEST Not all test results are available during your visit. If your  test results are not back during the visit, make an appointment with your caregiver to find out the results. Do not assume everything is normal if you have not heard from your caregiver or the medical facility. It is important  for you to follow up on all of your test results. HOME CARE INSTRUCTIONS   Take all medicines as directed.  Only take over-the-counter or prescription medicines for pain, discomfort, or fever as directed by your caregiver.  Resume daily activities as directed.  Showers are preferred over baths.  You may resume sexual activities in 1 week or as directed.  Do not drive while taking narcotics. SEEK MEDICAL CARE IF:   There is increasing abdominal pain.  There is new pain in the shoulders (shoulder strap areas).  You feel lightheaded or faint.  You have the chills.  You or your child has an oral temperature above 102 F (38.9 C).  There is pus-like (purulent) drainage from any of the wounds.  You are unable to pass gas or have a bowel movement.  You feel sick to your stomach (nauseous) or throw up (vomit). MAKE SURE YOU:   Understand these instructions.  Will watch your condition.  Will get help right away if you are not doing well or get worse. Document Released: 06/14/2000 Document Revised: 07/03/2012 Document Reviewed: 03/08/2007 [facility] Patient Information 2015 [place], [place]. This information is not intended to replace advice given to you by your health care provider. Make sure you discuss any questions you have with your health care provider.

## 2014-05-30 NOTE — Transfer of Care (Signed)
Immediate Anesthesia Transfer of Care Note  Patient: Peggy Boone  Procedure(s) Performed: Procedure(s) (LRB): LAPAROSCOPY DIAGNOSTIC, lysis of adhesions, removal of paratubular cyst (N/A)  Patient Location: PACU  Anesthesia Type: General  Level of Consciousness: awake, oriented, sedated and patient cooperative  Airway & Oxygen Therapy: Patient Spontanous Breathing and Patient connected to face mask oxygen  Post-op Assessment: Report given to PACU RN and Post -op Vital signs reviewed and stable  Post vital signs: Reviewed and stable  Complications: No apparent anesthesia complications

## 2014-05-30 NOTE — Anesthesia Postprocedure Evaluation (Signed)
  Anesthesia Post-op Note  Patient: Peggy Boone  Procedure(s) Performed: Procedure(s) (LRB): LAPAROSCOPY DIAGNOSTIC, lysis of adhesions, removal of paratubular cyst (N/A)  Patient Location: PACU  Anesthesia Type: General  Level of Consciousness: awake and alert   Airway and Oxygen Therapy: Patient Spontanous Breathing  Post-op Pain: mild  Post-op Assessment: Post-op Vital signs reviewed, Patient's Cardiovascular Status Stable, Respiratory Function Stable, Patent Airway and No signs of Nausea or vomiting  Last Vitals:  Filed Vitals:   05/30/14 1000  BP: 118/71  Pulse: 63  Temp:   Resp: 18    Post-op Vital Signs: stable   Complications: No apparent anesthesia complications

## 2014-05-31 ENCOUNTER — Encounter (HOSPITAL_BASED_OUTPATIENT_CLINIC_OR_DEPARTMENT_OTHER): Payer: Self-pay | Admitting: Obstetrics and Gynecology

## 2017-05-31 ENCOUNTER — Encounter (HOSPITAL_BASED_OUTPATIENT_CLINIC_OR_DEPARTMENT_OTHER): Payer: Self-pay | Admitting: *Deleted

## 2017-05-31 ENCOUNTER — Emergency Department (HOSPITAL_BASED_OUTPATIENT_CLINIC_OR_DEPARTMENT_OTHER): Payer: BLUE CROSS/BLUE SHIELD

## 2017-05-31 ENCOUNTER — Other Ambulatory Visit: Payer: Self-pay

## 2017-05-31 ENCOUNTER — Emergency Department (HOSPITAL_BASED_OUTPATIENT_CLINIC_OR_DEPARTMENT_OTHER)
Admission: EM | Admit: 2017-05-31 | Discharge: 2017-05-31 | Disposition: A | Discharge status: Home / Self Care | Payer: BLUE CROSS/BLUE SHIELD | Attending: Emergency Medicine | Admitting: Emergency Medicine

## 2017-05-31 DIAGNOSIS — N200 Calculus of kidney: Secondary | ICD-10-CM

## 2017-05-31 DIAGNOSIS — Z79899 Other long term (current) drug therapy: Secondary | ICD-10-CM | POA: Insufficient documentation

## 2017-05-31 DIAGNOSIS — R1032 Left lower quadrant pain: Secondary | ICD-10-CM | POA: Diagnosis present

## 2017-05-31 LAB — COMPREHENSIVE METABOLIC PANEL
ALK PHOS: 62 U/L (ref 38–126)
ALT: 17 U/L (ref 14–54)
ANION GAP: 9 (ref 5–15)
AST: 14 U/L — ABNORMAL LOW (ref 15–41)
Albumin: 3.4 g/dL — ABNORMAL LOW (ref 3.5–5.0)
BILIRUBIN TOTAL: 1.4 mg/dL — AB (ref 0.3–1.2)
BUN: 15 mg/dL (ref 6–20)
CALCIUM: 8.7 mg/dL — AB (ref 8.9–10.3)
CO2: 24 mmol/L (ref 22–32)
Chloride: 105 mmol/L (ref 101–111)
Creatinine, Ser: 0.78 mg/dL (ref 0.44–1.00)
GFR calc non Af Amer: >60 mL/min (ref >60)
Glucose, Bld: 94 mg/dL (ref 65–99)
Potassium: 3.2 mmol/L — ABNORMAL LOW (ref 3.5–5.1)
Sodium: 138 mmol/L (ref 135–145)
TOTAL PROTEIN: 6.8 g/dL (ref 6.5–8.1)

## 2017-05-31 LAB — URINALYSIS, MICROSCOPIC (REFLEX)

## 2017-05-31 LAB — CBC WITH DIFFERENTIAL/PLATELET
BASOS PCT: 0 %
Basophils Absolute: 0 10*3/uL (ref 0.0–0.1)
Eosinophils Absolute: 0.2 10*3/uL (ref 0.0–0.7)
Eosinophils Relative: 2 %
HCT: 38 % (ref 36.0–46.0)
Hemoglobin: 12.6 g/dL (ref 12.0–15.0)
Lymphocytes Relative: 31 %
Lymphs Abs: 2.7 10*3/uL (ref 0.7–4.0)
MCH: 29.2 pg (ref 26.0–34.0)
MCHC: 33.2 g/dL (ref 30.0–36.0)
MCV: 88 fL (ref 78.0–100.0)
MONOS PCT: 11 %
Monocytes Absolute: 1 10*3/uL (ref 0.1–1.0)
NEUTROS PCT: 56 %
Neutro Abs: 4.9 10*3/uL (ref 1.7–7.7)
Platelets: 291 10*3/uL (ref 150–400)
RBC: 4.32 MIL/uL (ref 3.87–5.11)
RDW: 13.1 % (ref 11.5–15.5)
WBC: 8.7 10*3/uL (ref 4.0–10.5)

## 2017-05-31 LAB — URINALYSIS, ROUTINE W REFLEX MICROSCOPIC
Bilirubin Urine: NEGATIVE
Glucose, UA: NEGATIVE mg/dL
KETONES UR: NEGATIVE mg/dL
LEUKOCYTES UA: NEGATIVE
NITRITE: NEGATIVE
PROTEIN: NEGATIVE mg/dL
Specific Gravity, Urine: 1.01 (ref 1.005–1.030)
pH: 5.5 (ref 5.0–8.0)

## 2017-05-31 LAB — LIPASE, BLOOD: Lipase: 23 U/L (ref 11–51)

## 2017-05-31 LAB — PREGNANCY, URINE: Preg Test, Ur: NEGATIVE

## 2017-05-31 MED ORDER — SODIUM CHLORIDE 0.9 % IV BOLUS (SEPSIS)
1000.0000 mL | Freq: Once | INTRAVENOUS | Status: AC
  Administered 2017-05-31: 1000 mL via INTRAVENOUS

## 2017-05-31 MED ORDER — ONDANSETRON HCL 4 MG PO TABS: 4.0000 mg | ORAL_TABLET | Freq: Four times a day (QID) | ORAL | 0 refills | Status: DC

## 2017-05-31 MED ORDER — TAMSULOSIN HCL 0.4 MG PO CAPS: 0.4000 mg | ORAL_CAPSULE | Freq: Every day | ORAL | 0 refills | Status: AC

## 2017-05-31 MED ORDER — HYDROMORPHONE HCL 1 MG/ML IJ SOLN
1.0000 mg | Freq: Once | INTRAMUSCULAR | Status: AC
  Administered 2017-05-31: 1 mg via INTRAVENOUS
  Filled 2017-05-31: qty 1

## 2017-05-31 MED ORDER — IOPAMIDOL (ISOVUE-300) INJECTION 61%
100.0000 mL | Freq: Once | INTRAVENOUS | Status: AC | PRN
  Administered 2017-05-31: 100 mL via INTRAVENOUS

## 2017-05-31 MED ORDER — ONDANSETRON HCL 4 MG/2ML IJ SOLN
4.0000 mg | Freq: Once | INTRAMUSCULAR | Status: AC
  Administered 2017-05-31: 4 mg via INTRAVENOUS
  Filled 2017-05-31: qty 2

## 2017-05-31 MED ORDER — MORPHINE SULFATE (PF) 4 MG/ML IV SOLN
4.0000 mg | Freq: Once | INTRAVENOUS | Status: AC
  Administered 2017-05-31: 4 mg via INTRAVENOUS
  Filled 2017-05-31: qty 1

## 2017-05-31 MED ORDER — PANTOPRAZOLE SODIUM 40 MG IV SOLR
40.0000 mg | Freq: Once | INTRAVENOUS | Status: AC
Start: 2017-05-31 — End: 2017-05-31
  Administered 2017-05-31: 40 mg via INTRAVENOUS
  Filled 2017-05-31: qty 40

## 2017-05-31 MED ORDER — HYDROCODONE-ACETAMINOPHEN 5-325 MG PO TABS: 1.0000 | ORAL_TABLET | Freq: Four times a day (QID) | ORAL | 0 refills | Status: DC | PRN

## 2017-05-31 MED ORDER — ACETAMINOPHEN-CODEINE 120-12 MG/5ML PO SUSP: 5.0000 mL | Freq: Three times a day (TID) | ORAL | 0 refills | Status: DC | PRN

## 2017-05-31 NOTE — ED Triage Notes (Addendum)
6 days of constipation, vomiting, lower abdominal pain. She drank mag citrate 2 days ago and had a BM.
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
## 2017-05-31 NOTE — ED Notes (Signed)
Pt has finished contrast.  

## 2017-05-31 NOTE — ED Notes (Signed)
Pt. Eating crackers and peanut butter at this time.

## 2017-05-31 NOTE — Discharge Instructions (Signed)
You can take Tylenol you can take Tylenol 1000 mg 3 times a day for pain.  Do not exceed 4000 mg a day.  He can take the pain medication for severe or breakthrough pain.  Take Zofran as needed for nausea and vomiting.  Take Flomax as directed.  Strain your urine as instructed.  Follow-up with referred urologist office in the next few days.  Call their office and arrange for an appointment.  Return to the emergency department for any worsening pain, fever, persistent vomiting, inability to keep any food or drink down, difficulty breathing or any other worsening or concerning symptoms.

## 2017-06-03 ENCOUNTER — Other Ambulatory Visit: Payer: Self-pay

## 2017-06-03 ENCOUNTER — Encounter (HOSPITAL_COMMUNITY): Payer: Self-pay | Admitting: *Deleted

## 2017-06-03 ENCOUNTER — Other Ambulatory Visit: Payer: Self-pay | Admitting: Urology

## 2017-06-06 ENCOUNTER — Ambulatory Visit (HOSPITAL_COMMUNITY): Payer: BLUE CROSS/BLUE SHIELD

## 2017-06-06 ENCOUNTER — Ambulatory Visit (HOSPITAL_COMMUNITY)
Admission: RE | Admit: 2017-06-06 | Discharge: 2017-06-06 | Disposition: A | Discharge status: Home / Self Care | Payer: BLUE CROSS/BLUE SHIELD | Source: Ambulatory Visit | Attending: Urology | Admitting: Urology

## 2017-06-06 ENCOUNTER — Encounter (HOSPITAL_COMMUNITY): Payer: Self-pay | Admitting: *Deleted

## 2017-06-06 ENCOUNTER — Encounter (HOSPITAL_COMMUNITY)
Admission: RE | Disposition: A | Discharge status: Home / Self Care | Payer: Self-pay | Source: Ambulatory Visit | Attending: Urology

## 2017-06-06 DIAGNOSIS — Z87442 Personal history of urinary calculi: Secondary | ICD-10-CM | POA: Diagnosis not present

## 2017-06-06 DIAGNOSIS — Z9884 Bariatric surgery status: Secondary | ICD-10-CM | POA: Diagnosis not present

## 2017-06-06 DIAGNOSIS — E669 Obesity, unspecified: Secondary | ICD-10-CM | POA: Insufficient documentation

## 2017-06-06 DIAGNOSIS — N201 Calculus of ureter: Secondary | ICD-10-CM | POA: Insufficient documentation

## 2017-06-06 DIAGNOSIS — Z79899 Other long term (current) drug therapy: Secondary | ICD-10-CM | POA: Insufficient documentation

## 2017-06-06 DIAGNOSIS — Z7952 Long term (current) use of systemic steroids: Secondary | ICD-10-CM | POA: Insufficient documentation

## 2017-06-06 DIAGNOSIS — Z6835 Body mass index (BMI) 35.0-35.9, adult: Secondary | ICD-10-CM | POA: Diagnosis not present

## 2017-06-06 HISTORY — DX: Personal history of urinary calculi: Z87.442

## 2017-06-06 HISTORY — PX: EXTRACORPOREAL SHOCK WAVE LITHOTRIPSY: SHX1557

## 2017-06-06 SURGERY — LITHOTRIPSY, ESWL
Anesthesia: LOCAL | Laterality: Left

## 2017-06-06 MED ORDER — SODIUM CHLORIDE 0.9% FLUSH: 3.0000 mL | INTRAVENOUS | Status: DC | PRN

## 2017-06-06 MED ORDER — DIPHENHYDRAMINE HCL 25 MG PO CAPS: 25.0000 mg | ORAL_CAPSULE | ORAL | Status: DC

## 2017-06-06 MED ORDER — DIAZEPAM 1 MG/ML PO SOLN
10.0000 mg | ORAL | Status: AC
  Administered 2017-06-06: 10 mg via ORAL
  Filled 2017-06-06: qty 10

## 2017-06-06 MED ORDER — SODIUM CHLORIDE 0.9 % IV SOLN: 250.0000 mL | INTRAVENOUS | Status: DC | PRN

## 2017-06-06 MED ORDER — SODIUM CHLORIDE 0.9% FLUSH: 3.0000 mL | Freq: Two times a day (BID) | INTRAVENOUS | Status: DC

## 2017-06-06 MED ORDER — ACETAMINOPHEN 650 MG RE SUPP
650.0000 mg | RECTAL | Status: DC | PRN
  Filled 2017-06-06: qty 1

## 2017-06-06 MED ORDER — SODIUM CHLORIDE 0.9 % IV SOLN
INTRAVENOUS | Status: DC
  Administered 2017-06-06 (×2): via INTRAVENOUS

## 2017-06-06 MED ORDER — ACETAMINOPHEN 325 MG PO TABS: 650.0000 mg | ORAL_TABLET | ORAL | Status: DC | PRN

## 2017-06-06 MED ORDER — ACETAMINOPHEN-CODEINE 120-12 MG/5ML PO SUSP: 10.0000 mL | Freq: Four times a day (QID) | ORAL | 0 refills | Status: AC | PRN

## 2017-06-06 MED ORDER — ONDANSETRON HCL 4 MG/2ML IJ SOLN
4.0000 mg | Freq: Once | INTRAMUSCULAR | Status: AC
  Administered 2017-06-06: 4 mg via INTRAVENOUS
  Filled 2017-06-06: qty 2

## 2017-06-06 MED ORDER — MORPHINE SULFATE (PF) 4 MG/ML IV SOLN
2.0000 mg | INTRAVENOUS | Status: DC | PRN
  Administered 2017-06-06: 2 mg via INTRAVENOUS
  Filled 2017-06-06: qty 1

## 2017-06-06 MED ORDER — CIPROFLOXACIN HCL 500 MG PO TABS
500.0000 mg | ORAL_TABLET | ORAL | Status: AC
  Administered 2017-06-06: 500 mg via ORAL
  Filled 2017-06-06: qty 1

## 2017-06-06 MED ORDER — ONDANSETRON 4 MG PO TBDP: 4.0000 mg | ORAL_TABLET | Freq: Three times a day (TID) | ORAL | 0 refills | Status: AC | PRN

## 2017-06-06 MED ORDER — DIPHENHYDRAMINE HCL 12.5 MG/5ML PO ELIX
25.0000 mg | ORAL_SOLUTION | ORAL | Status: AC
  Administered 2017-06-06: 25 mg via ORAL
  Filled 2017-06-06: qty 10

## 2017-06-06 MED ORDER — DIAZEPAM 5 MG PO TABS: 10.0000 mg | ORAL_TABLET | ORAL | Status: DC

## 2017-06-06 NOTE — Discharge Instructions (Addendum)
Moderate Conscious Sedation, Adult, Care After °These instructions provide you with information about caring for yourself after your procedure. Your health care provider may also give you more specific instructions. Your treatment has been planned according to current medical practices, but problems sometimes occur. Call your health care provider if you have any problems or questions after your procedure. °What can I expect after the procedure? °After your procedure, it is common: °To feel sleepy for several hours. °To feel clumsy and have poor balance for several hours. °To have poor judgment for several hours. °To vomit if you eat too soon. ° °Follow these instructions at home: °For at least 24 hours after the procedure: ° °Do not: °Participate in activities where you could fall or become injured. °Drive. °Use heavy machinery. °Drink alcohol. °Take sleeping pills or medicines that cause drowsiness. °Make important decisions or sign legal documents. °Take care of children on your own. °Rest. °Eating and drinking °Follow the diet recommended by your health care provider. °If you vomit: °Drink water, juice, or soup when you can drink without vomiting. °Make sure you have little or no nausea before eating solid foods. °General instructions °Have a responsible adult stay with you until you are awake and alert. °Take over-the-counter and prescription medicines only as told by your health care provider. °If you smoke, do not smoke without supervision. °Keep all follow-up visits as told by your health care provider. This is important. °Contact a health care provider if: °You keep feeling nauseous or you keep vomiting. °You feel light-headed. °You develop a rash. °You have a fever. °Get help right away if: °You have trouble breathing. °This information is not intended to replace advice given to you by your health care provider. Make sure you discuss any questions you have with your health care provider. °Document Released:  12/27/2012 Document Revised: 08/11/2015 Document Reviewed: 06/28/2015 °Elsevier Interactive Patient Education © 2018 [employer] °Lithotripsy, Care After °This sheet gives you information about how to care for yourself after your procedure. Your health care provider may also give you more specific instructions. If you have problems or questions, contact your health care provider. °What can I expect after the procedure? °After the procedure, it is common to have: °· Some blood in your urine. This should only last for a few days. °· Soreness in your back, sides, or upper abdomen for a few days. °· Blotches or bruises on your back where the pressure wave entered the skin. °· Pain, discomfort, or nausea when pieces (fragments) of the kidney stone move through the tube that carries urine from the kidney to the bladder (ureter). Stone fragments may pass soon after the procedure, but they may continue to pass for up to 4-8 weeks. °? If you have severe pain or nausea, contact your health care provider. This may be caused by a large stone that was not broken up, and this may mean that you need more treatment. °· Some pain or discomfort during urination. °· Some pain or discomfort in the lower abdomen or (in men) at the base of the penis. ° °Follow these instructions at home: °Medicines °· Take over-the-counter and prescription medicines only as told by your health care provider. °· If you were prescribed an antibiotic medicine, take it as told by your health care provider. Do not stop taking the antibiotic even if you start to feel better. °· Do not drive for 24 hours if you were given a medicine to help you relax (sedative). °· Do not drive   or use heavy machinery while taking prescription pain medicine. °Eating and drinking °· Drink enough water and fluids to keep your urine clear or pale yellow. This helps any remaining pieces of the stone to pass. It can also help prevent new stones from forming. °· Eat plenty of fresh  fruits and vegetables. °· Follow instructions from your health care provider about eating and drinking restrictions. You may be instructed: °? To reduce how much salt (sodium) you eat or drink. Check ingredients and nutrition facts on packaged foods and beverages. °? To reduce how much meat you eat. °· Eat the recommended amount of calcium for your age and gender. Ask your health care provider how much calcium you should have. °General instructions °· Get plenty of rest. °· Most people can resume normal activities 1-2 days after the procedure. Ask your health care provider what activities are safe for you. °· If directed, strain all urine through the strainer that was provided by your health care provider. °? Keep all fragments for your health care provider to see. Any stones that are found may be sent to a medical lab for examination. The stone may be as small as a grain of salt. °· Keep all follow-up visits as told by your health care provider. This is important. °Contact a health care provider if: °· You have pain that is severe or does not get better with medicine. °· You have nausea that is severe or does not go away. °· You have blood in your urine longer than your health care provider told you to expect. °· You have more blood in your urine. °· You have pain during urination that does not go away. °· You urinate more frequently than usual and this does not go away. °· You develop a rash or any other possible signs of an allergic reaction. °Get help right away if: °· You have severe pain in your back, sides, or upper abdomen. °· You have severe pain while urinating. °· Your urine is very dark red. °· You have blood in your stool (feces). °· You cannot pass any urine at all. °· You feel a strong urge to urinate after emptying your bladder. °· You have a fever or chills. °· You develop shortness of breath, difficulty breathing, or chest pain. °· You have severe nausea that leads to persistent vomiting. °· You  faint. °Summary °· After this procedure, it is common to have some pain, discomfort, or nausea when pieces (fragments) of the kidney stone move through the tube that carries urine from the kidney to the bladder (ureter). If this pain or nausea is severe, however, you should contact your health care provider. °· Most people can resume normal activities 1-2 days after the procedure. Ask your health care provider what activities are safe for you. °· Drink enough water and fluids to keep your urine clear or pale yellow. This helps any remaining pieces of the stone to pass, and it can help prevent new stones from forming. °· If directed, strain your urine and keep all fragments for your health care provider to see. Fragments or stones may be as small as a grain of salt. °· Get help right away if you have severe pain in your back, sides, or upper abdomen or have severe pain while urinating. °This information is not intended to replace advice given to you by your health care provider. Make sure you discuss any questions you have with your health care provider. °Document Released: 03/28/2007 Document Revised:   01/28/2016 Document Reviewed: 01/28/2016 °Elsevier Interactive Patient Education © 2018 [employer] ° °

## 2017-06-06 NOTE — Progress Notes (Signed)
Patient states post gastric bypass surgery and she prefers any meds to be elixir if possible rather than pill form. Called pharmacy and benadryl and valium given elixir form. Able to swallow cipro pill fine.

## 2017-06-06 NOTE — H&P (Signed)
CC/HPI: Kidney stone   Peggy Boone is a patient of Dr. Mena Goes with a history of urolithiasis. She presents today after not having been seen in over 3 years. Approximately 1 week ago, she developed a left lower quadrant pain with associated nausea and vomiting. She eventually presented to the emergency department on 05/31/2017 and a CT scan of the abdomen and pelvis was performed indicating a 6-7 mm left proximal ureteral stone. On independent review, this measures just over 600 Hounsfield units. She does have associated hydronephrosis. She denies any fever or gross hematuria. She has continued having pain intermittently. Currently, her pain and her nausea have been controlled with medication. She has been given prescriptions both for Percocet and Zofran.     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, Tonsillectomy   NON-GU PSH: Arthroscopy Joint - 2015 Gastric Bypass For Obesity - 2015 Laparoscopic Supracervical Hysterectomy (subtotal Hysterectomy), With Or Without Removal Of Tube(s), Remove Tonsils - 2015    GU PMH: Renal calculus, Nephrolithiasis - 2015    NON-GU PMH: Personal history of other diseases of the digestive system, History of esophageal reflux - 2015    FAMILY HISTORY: Colon Cancer - Runs In Family Gout - Runs In Family sickle cell anemia - Runs In Family Thyroid Disease - Runs In Family   SOCIAL HISTORY: Marital Status: Single Preferred Language: English Current Smoking Status: Patient has never smoked.   Tobacco Use Assessment Completed: Used  Tobacco in last 30 days? Has never drank.  Drinks 1 caffeinated drink per day.     Notes: Never a smoker, Single, Occupation, Caffeine use, Former tobacco use, Alcohol use   REVIEW OF SYSTEMS:    GU Review Female:   Patient denies frequent urination, hard to postpone urination, burning /pain with urination, get up at night to urinate, leakage of urine, stream starts and stops, trouble starting your stream, have to strain to urinate, and currently pregnant.  Gastrointestinal (Lower):   Patient reports constipation. Patient denies diarrhea.  Gastrointestinal (Upper):   Patient reports nausea and vomiting.   Constitutional:   Patient reports fatigue. Patient denies fever, night sweats, and weight loss.  Skin:   Patient denies itching and skin rash/ lesion.  Eyes:   Patient denies blurred vision and double vision.  Ears/ Nose/ Throat:   Patient reports sore throat. Patient denies sinus problems.  Hematologic/Lymphatic:   Patient denies swollen glands and easy bruising.  Cardiovascular:   Patient denies leg swelling and chest pains.  Respiratory:   Patient denies cough and shortness of breath.  Endocrine:   Patient denies excessive thirst.  Musculoskeletal:   Patient denies back pain and joint pain.  Neurological:   Patient reports headaches. Patient denies dizziness.  Psychologic:   Patient denies depression and anxiety.   VITAL SIGNS:      06/03/2017 08:56 AM  Weight 230.2 lb / 104.42 kg  Height 69 in / 175.26 cm  BP 119/79 mmHg  Pulse 75 /min  Temperature 98.3 F / 36.8 C  BMI 34.0 kg/m   MULTI-SYSTEM PHYSICAL EXAMINATION:    Constitutional: Well-nourished. No physical  deformities. Normally developed. Good grooming.  Neck: Neck symmetrical, not swollen. Normal tracheal position.  Respiratory: No labored breathing, no use of accessory muscles. Normal breath sounds.  Cardiovascular: Regular rate and rhythm. No murmur, no gallop. Normal temperature, normal extremity pulses, no swelling,  no varicosities.  Lymphatic: No enlargement of neck, axillae, groin.  Skin: No paleness, no jaundice, no cyanosis. No lesion, no ulcer, no rash.  Neurologic / Psychiatric: Oriented to time, oriented to place, oriented to person. No depression, no anxiety, no agitation.  Gastrointestinal: She does have mild-to-moderate left lower quadrant tenderness without rebound tenderness or guarding. No CVA tenderness.  Eyes: Normal conjunctivae. Normal eyelids.  Ears, Nose, Mouth, and Throat: Left ear no scars, no lesions, no masses. Right ear no scars, no lesions, no masses. Nose no scars, no lesions, no masses. Normal hearing. Normal lips.  Musculoskeletal: Normal gait and station of head and neck.     PAST DATA REVIEWED:  Source Of History:  Patient  Urine Test Review:   Urinalysis  X-Ray Review: KUB: Reviewed Films.  C.T. Abdomen/Pelvis: Reviewed Films.    Notes:                     I independently reviewed her CT scan with findings as dictated above. I have also independently reviewed her KUB x-ray. This does demonstrate a 7-8 mm radiopaque left proximal ureteral calculus. She also has a left lower pole nonobstructing renal calculus that is visualized.   PROCEDURES:         KUB - [id number]  A single view of the abdomen is obtained.               Urinalysis Dipstick Dipstick Cont'd  Color: Yellow Bilirubin: Neg  Appearance: Clear Ketones: Neg  Specific Gravity: 1.025 Blood: Neg  pH: <=5.0 Protein: Neg  Glucose: Neg Urobilinogen: 0.2    Nitrites: Neg    Leukocyte Esterase: Neg    ASSESSMENT:      ICD-10 Details  1 GU:   Ureteral calculus - N20.1    PLAN:            Medications New Meds: Oxycodone Hcl 5 mg/5 ml solution, oral 5 ml PO Q 6 H PRN   #100  0 Refill(s)            Orders Labs BMP  X-Rays: KUB  X-Ray Notes: ...          Schedule Return Visit/Planned Activity: Other See Visit Notes             Note: Will be scheduled for surgery.          Document Letter(s):   Created for Patient: Clinical Summary         Notes:   1. Left proximal ureteral calculus: We have reviewed options for management today including the option of medical expulsion therapy, or intervention with ureteroscopy versus shockwave lithotripsy. After reviewing the pros and cons of each approach, she does wish to proceed with definitive intervention as she does have a trip planned for March 26th. After reviewing the pros and cons of ureteroscopy and shockwave lithotripsy, she does wish to proceed with shockwave lithotripsy. Based on her stone characteristics, size, and location, this would appear to be a very reasonable approach. Furthermore, she has had successful treatment in the past with Dr. Mena GoesEskridge. We have reviewed the potential risks and complications of this procedure and the expected recovery process. She will be scheduled for next week. Her KUB  image has been marked and measured today.   2. Recurring urolithiasis: She will ultimately need follow-up with Dr. Mena Goes to discuss stone prevention and a possible metabolic evaluation.   Cc:        Next Appointment:      Next Appointment: 06/06/2017 10:00 AM    Appointment Type: Surgery     Location: [facility] [phone]    Provider: Bjorn Pippin, M.D.    Reason for Visit: WL/OP LT ESWL

## 2017-06-07 ENCOUNTER — Encounter (HOSPITAL_COMMUNITY): Payer: Self-pay | Admitting: Urology

## 2018-11-05 IMAGING — CT CT ABD-PELV W/ CM
2 of 5 series · 15 of 46 positions shown, 17 images · IV contrast (iopamidol)
Comparison: [HOSPITAL] CT Abdomen 01/29/2014

CLINICAL DATA: 41-year-old female with left lower quadrant
abdominal pain with vomiting and constipation. Prior abdominal
surgery.

EXAM:
CT ABDOMEN AND PELVIS WITH CONTRAST
TECHNIQUE: Multidetector CT imaging of the abdomen and pelvis was performed
using the standard protocol following bolus administration of
intravenous contrast.
CONTRAST:  100mL [id number] IOPAMIDOL ([id number]) INJECTION 61%

[Series 2: axial st · axial · 0.98mm/px · z∈[-855,-420]mm · 12 of 99 slices shown, 14 images]
[im 6/99  soft-tissue]
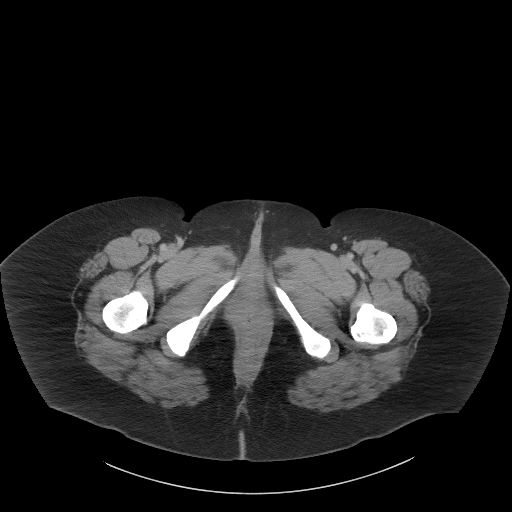
[im 6/99  bone]
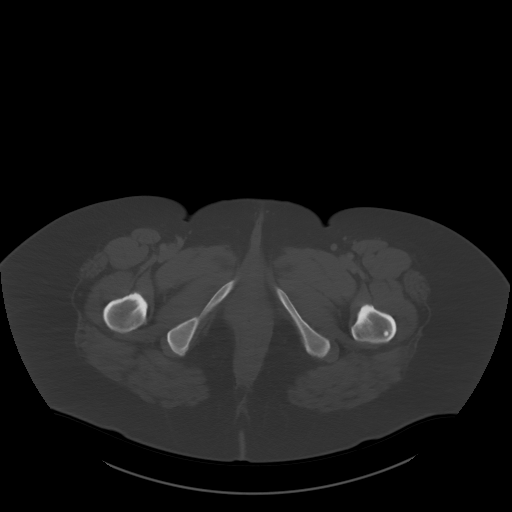
[im 17/99  soft-tissue]
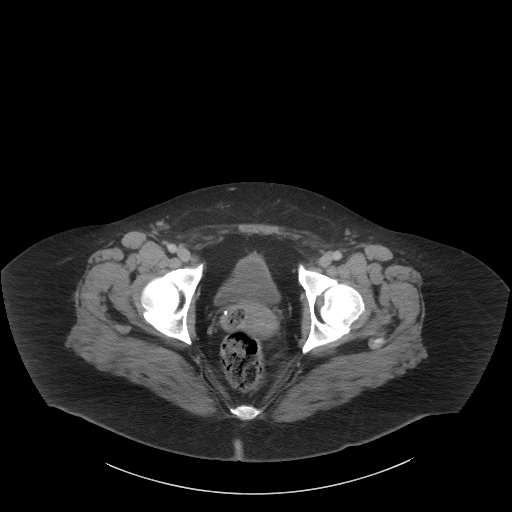
[im 22/99  soft-tissue]
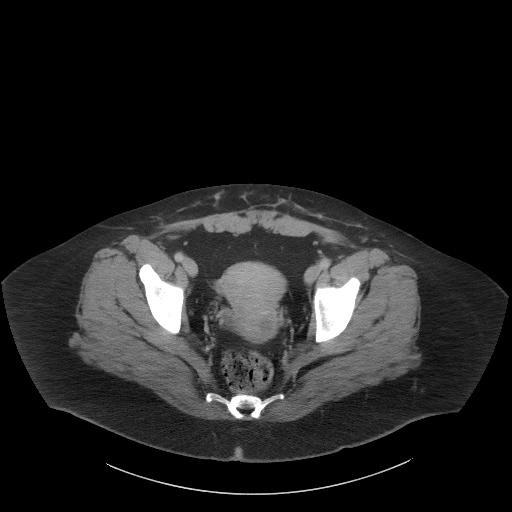
[im 28/99  soft-tissue]
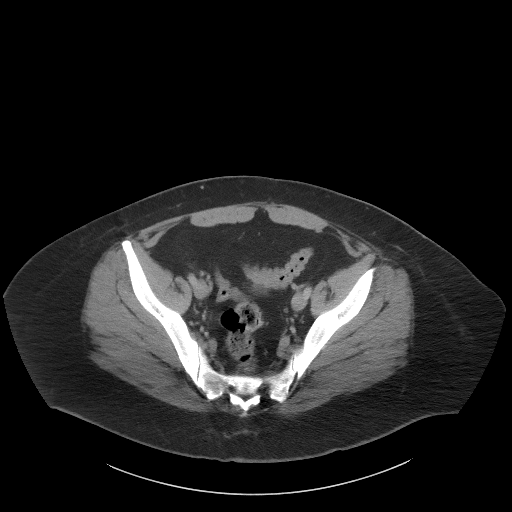
[im 39/99  soft-tissue]
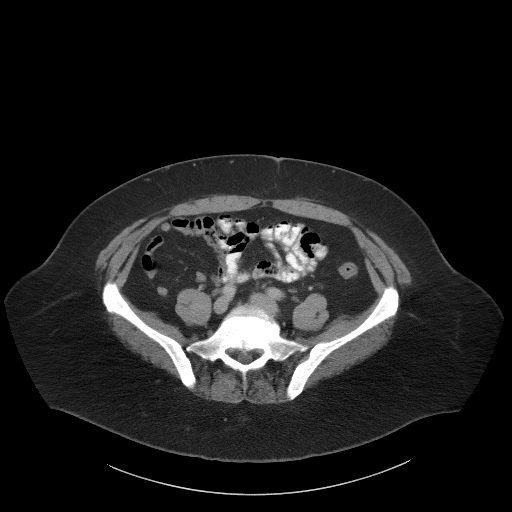
[im 44/99  soft-tissue]
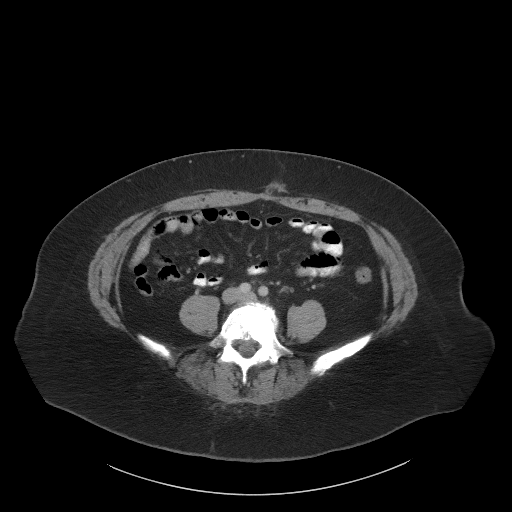
[im 55/99  soft-tissue]
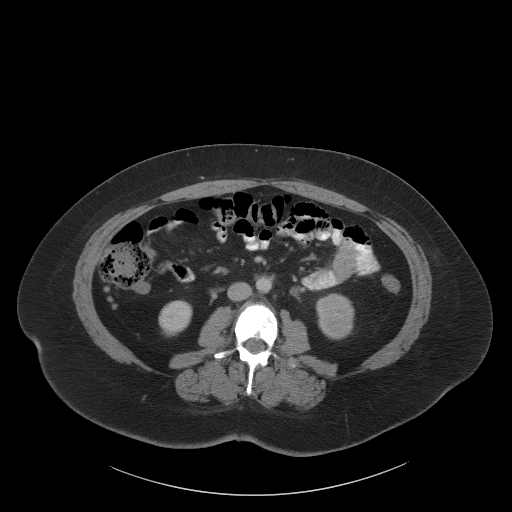
[im 60/99  soft-tissue]
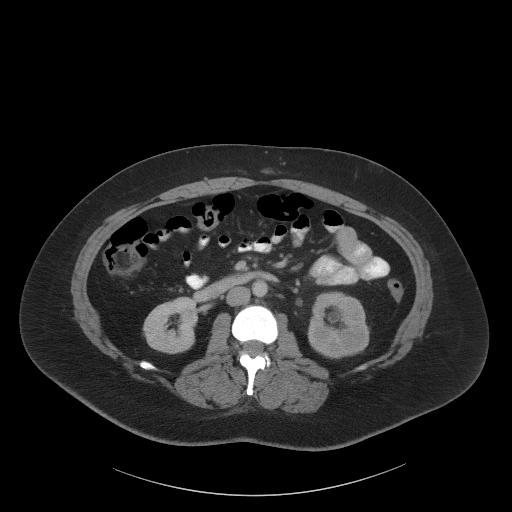
[im 71/99  soft-tissue]
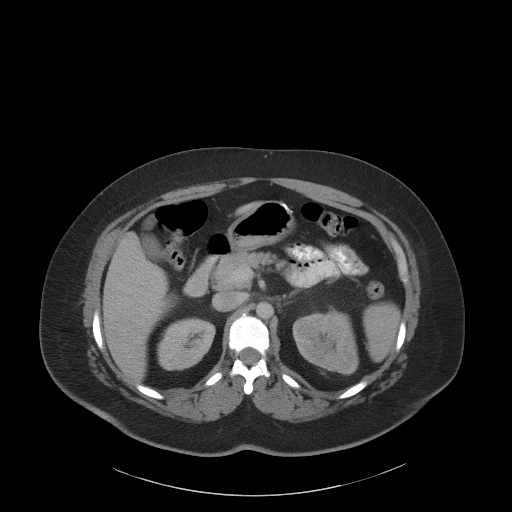
[im 71/99  bone]
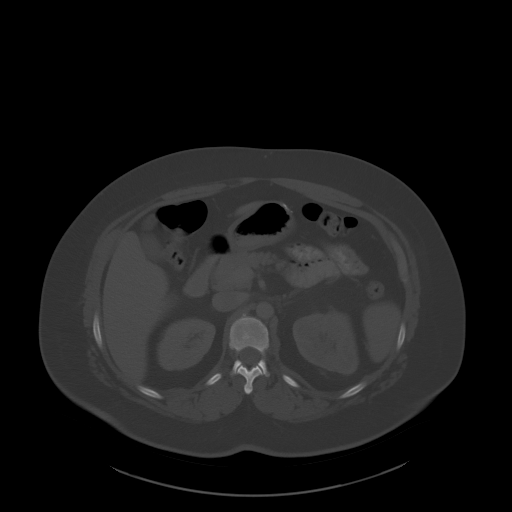
[im 77/99  soft-tissue]
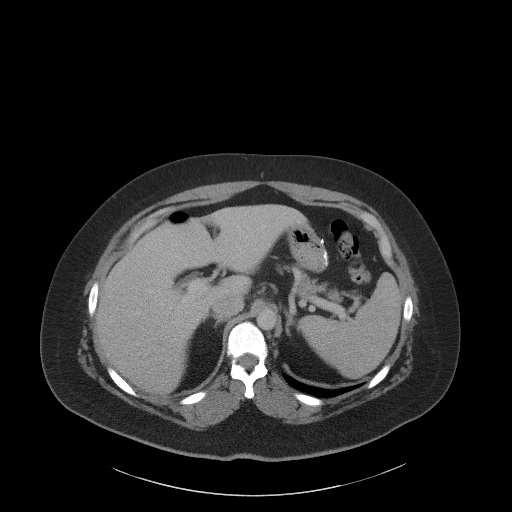
[im 82/99  soft-tissue]
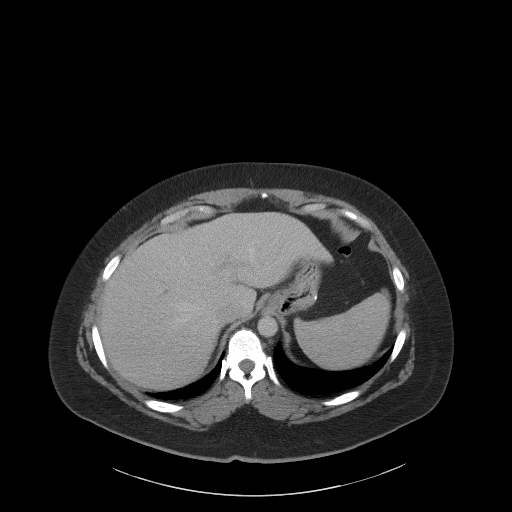
[im 93/99  soft-tissue]
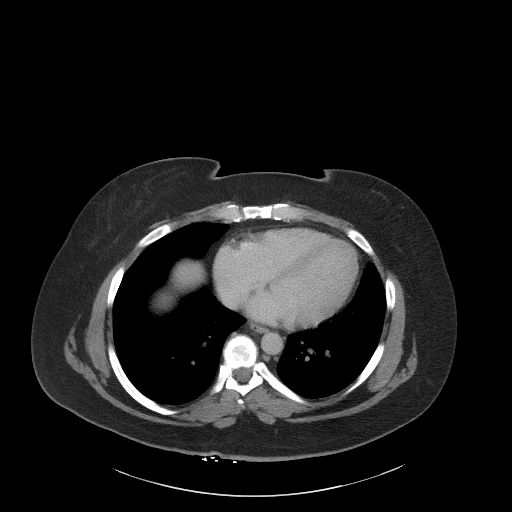

[Series 5: coronal st · coronal · 0.93mm/px · 3 of 94 slices shown]
[im 32/94  soft-tissue]
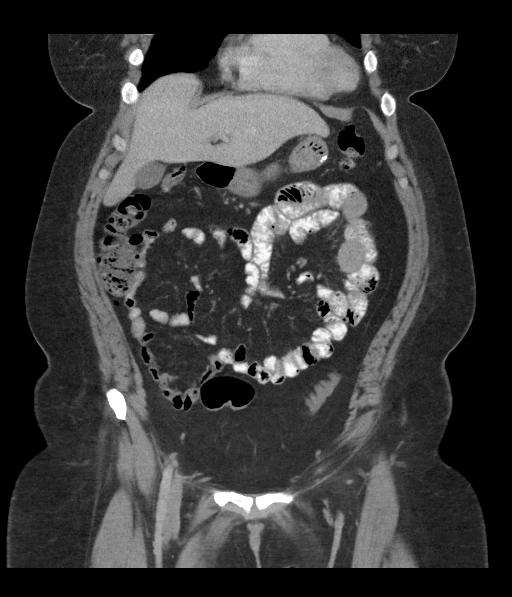
[im 42/94  soft-tissue]
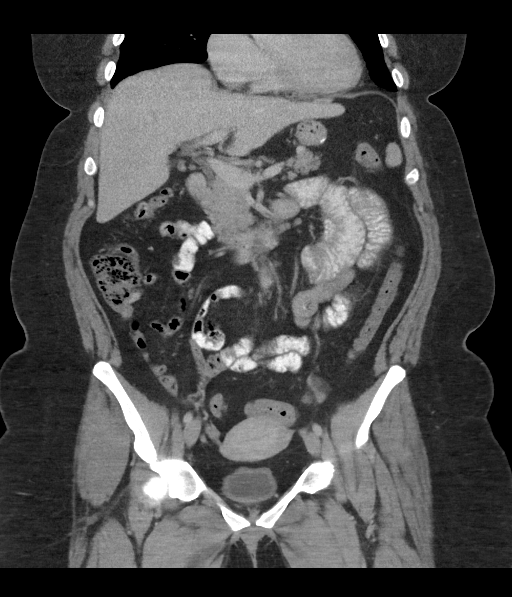
[im 52/94  soft-tissue]
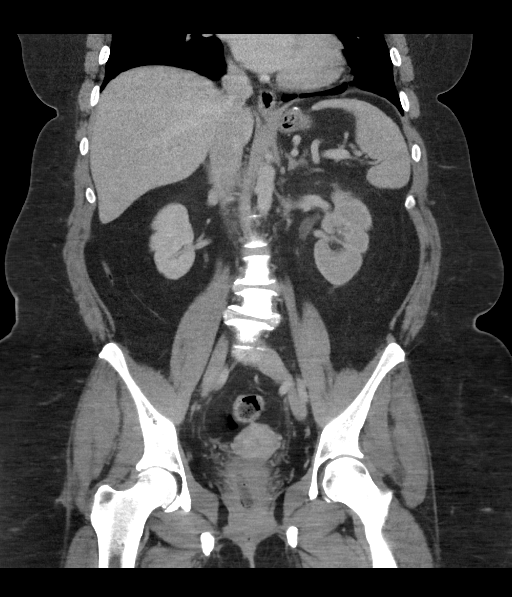

[15 of 46 positions shown; findings below may reference images not displayed]

FINDINGS: Lower chest: Negative lung bases. Cardiac size is at the upper
limits of normal. No pericardial or pleural effusion.

Hepatobiliary: A vague small 8 mm low-density area in the right
hepatic lobe (series 2, image 21). Was not evident on the comparison
noncontrast study and is too small to characterize but probably
benign. There is a similar small hypodense area in the liver dome
(series 2, image 10). These are of doubtful significance.

Negative gallbladder.

Pancreas: Negative.

Spleen: Negative.

Adrenals/Urinary Tract:

Normal adrenal glands. The right kidney and right ureter are normal.

The urinary bladder is decompressed.

There is left nephro megaly, moderate left hydronephrosis, mild left
perinephric stranding, and a delayed left nephrogram. There is left
lower pole nephrolithiasis measuring 5 millimeters. There is an
obstructing proximal left ureteral calculus measuring 6 x 7
millimeters which has a teardrop shaped (coronal image 49). This
calculus is located 2-3 centimeters from the left UPJ. Distal to the
obstructing stone the left ureter appears normal to the urinary
bladder.

Incidental small bilateral pelvic phleboliths.

Stomach/Bowel: Negative rectum aside from some retained stool.
Decompressed and negative sigmoid colon and descending colon.
Negative transverse colon. Mild retained stool in the right colon.
Normal retrocecal appendix. Negative terminal ileum. Oral contrast
has not yet reached the distal small bowel. No dilated or abnormal
small bowel loops identified. Stable postoperative appearance of the
proximal stomach with staple line along the greater curvature.
Negative duodenum.

No abdominal free air or free fluid.

Vascular/Lymphatic: Suboptimal intravascular contrast but the major
arterial structures in the abdomen and pelvis appear patent. The
portal venous system appears patent.

No lymphadenopathy.

Reproductive: Incidental tampon in place. Negative uterus and
ovaries.

Other: No pelvic free fluid. Mild postoperative changes to the
ventral abdominal wall.

Musculoskeletal: Advanced lower lumbar disc and endplate
degeneration. Occasional benign bone islands suspected, including in
the left acetabulum, right superior pubic ramus, and left proximal
femur. No acute osseous abnormality identified.
IMPRESSION: 1. Acute obstructive uropathy on the left. An obstructing 6 x 7 mm
proximal left ureteral calculus is located 2-3 centimeters from the
left UPJ.
2. Superimposed 4 millimeter left lower pole nephrolithiasis.
